# Patient Record
Sex: Female | Born: 1985 | Race: White | Hispanic: No | Marital: Married | Smoking: Never smoker
Health system: Southern US, Community
[De-identification: ages and names within clinical notes are randomized; demographics above are authoritative.]

---

## 2014-05-25 ENCOUNTER — Encounter (HOSPITAL_COMMUNITY): Payer: Self-pay | Admitting: Emergency Medicine

## 2014-05-25 ENCOUNTER — Emergency Department (HOSPITAL_COMMUNITY)
Admission: EM | Admit: 2014-05-25 | Discharge: 2014-05-25 | Disposition: A | Discharge status: Home / Self Care | Payer: No Typology Code available for payment source | Attending: Emergency Medicine | Admitting: Emergency Medicine

## 2014-05-25 ENCOUNTER — Emergency Department (HOSPITAL_COMMUNITY): Payer: No Typology Code available for payment source

## 2014-05-25 DIAGNOSIS — S0181XA Laceration without foreign body of other part of head, initial encounter: Secondary | ICD-10-CM

## 2014-05-25 DIAGNOSIS — Y929 Unspecified place or not applicable: Secondary | ICD-10-CM | POA: Diagnosis not present

## 2014-05-25 DIAGNOSIS — Y9389 Activity, other specified: Secondary | ICD-10-CM | POA: Insufficient documentation

## 2014-05-25 DIAGNOSIS — S0180XA Unspecified open wound of other part of head, initial encounter: Secondary | ICD-10-CM | POA: Insufficient documentation

## 2014-05-25 DIAGNOSIS — W2209XA Striking against other stationary object, initial encounter: Secondary | ICD-10-CM | POA: Insufficient documentation

## 2014-05-25 DIAGNOSIS — S0990XA Unspecified injury of head, initial encounter: Secondary | ICD-10-CM | POA: Diagnosis present

## 2014-05-25 MED ORDER — ONDANSETRON 4 MG PO TBDP
4.0000 mg | ORAL_TABLET | Freq: Once | ORAL | Status: AC
Start: 2014-05-25 — End: 2014-05-25
  Administered 2014-05-25: 4 mg via ORAL
  Filled 2014-05-25: qty 1

## 2014-05-25 MED ORDER — HYDROCODONE-ACETAMINOPHEN 5-325 MG PO TABS
1.0000 | ORAL_TABLET | Freq: Once | ORAL | Status: AC
  Administered 2014-05-25: 1 via ORAL
  Filled 2014-05-25: qty 1

## 2014-05-25 NOTE — ED Notes (Signed)
Dr. Kunz at bedside.  

## 2014-05-25 NOTE — ED Notes (Signed)
The pt has swelling to her rt orbit and a small laceration under her rt eye,  She was struck by a softball that bounced off the ground after striking the base.  No loc.  No vision problems she had on sunglasses and they were broken.  No hyphema seen bleeding controlled.  lmp April 4th she may be pregnant

## 2014-05-25 NOTE — Discharge Instructions (Signed)
Facial Laceration ° A facial laceration is a cut on the face. These injuries can be painful and cause bleeding. Lacerations usually heal quickly, but they need special care to reduce scarring. °DIAGNOSIS  °Your health care provider will take a medical history, ask for details about how the injury occurred, and examine the wound to determine how deep the cut is. °TREATMENT  °Some facial lacerations may not require closure. Others may not be able to be closed because of an increased risk of infection. The risk of infection and the chance for successful closure will depend on various factors, including the amount of time since the injury occurred. °The wound may be cleaned to help prevent infection. If closure is appropriate, pain medicines may be given if needed. Your health care provider will use stitches (sutures), wound glue (adhesive), or skin adhesive strips to repair the laceration. These tools bring the skin edges together to allow for faster healing and a better cosmetic outcome. If needed, you may also be given a tetanus shot. °HOME CARE INSTRUCTIONS °· Only take over-the-counter or prescription medicines as directed by your health care provider. °· Follow your health care provider's instructions for wound care. These instructions will vary depending on the technique used for closing the wound. °For Sutures: °· Keep the wound clean and dry.   °· If you were given a bandage (dressing), you should change it at least once a day. Also change the dressing if it becomes wet or dirty, or as directed by your health care provider.   °· Wash the wound with soap and water 2 times a day. Rinse the wound off with water to remove all soap. Pat the wound dry with a clean towel.   °· After cleaning, apply a thin layer of the antibiotic ointment recommended by your health care provider. This will help prevent infection and keep the dressing from sticking.   °· You may shower as usual after the first 24 hours. Do not soak the  wound in water until the sutures are removed.   °· Get your sutures removed as directed by your health care provider. With facial lacerations, sutures should usually be taken out after 4 5 days to avoid stitch marks.   °· Wait a few days after your sutures are removed before applying any makeup. °For Skin Adhesive Strips: °· Keep the wound clean and dry.   °· Do not get the skin adhesive strips wet. You may bathe carefully, using caution to keep the wound dry.   °· If the wound gets wet, pat it dry with a clean towel.   °· Skin adhesive strips will fall off on their own. You may trim the strips as the wound heals. Do not remove skin adhesive strips that are still stuck to the wound. They will fall off in time.   °For Wound Adhesive: °· You may briefly wet your wound in the shower or bath. Do not soak or scrub the wound. Do not swim. Avoid periods of heavy sweating until the skin adhesive has fallen off on its own. After showering or bathing, gently pat the wound dry with a clean towel.   °· Do not apply liquid medicine, cream medicine, ointment medicine, or makeup to your wound while the skin adhesive is in place. This may loosen the film before your wound is healed.   °· If a dressing is placed over the wound, be careful not to apply tape directly over the skin adhesive. This may cause the adhesive to be pulled off before the wound is healed.   °·   Avoid prolonged exposure to sunlight or tanning lamps while the skin adhesive is in place. °· The skin adhesive will usually remain in place for 5 10 days, then naturally fall off the skin. Do not pick at the adhesive film.   °After Healing: °Once the wound has healed, cover the wound with sunscreen during the day for 1 full year. This can help minimize scarring. Exposure to ultraviolet light in the first year will darken the scar. It can take 1 2 years for the scar to lose its redness and to heal completely.  °SEEK IMMEDIATE MEDICAL CARE IF: °· You have redness, pain, or  swelling around the wound.   °· You see a yellowish-white fluid (pus) coming from the wound.   °· You have chills or a fever.   °MAKE SURE YOU: °· Understand these instructions. °· Will watch your condition. °· Will get help right away if you are not doing well or get worse. °Document Released: 01/20/2005 Document Revised: 10/03/2013 Document Reviewed: 07/26/2013 °Exit[employer]® Patient Information ©2014 [employer]. ° °

## 2014-05-25 NOTE — ED Provider Notes (Signed)
CSN: [id number]     Arrival date & time 05/25/14  1630 History   First MD Initiated Contact with Patient 05/25/14 1638     Chief Complaint  Patient presents with  . Facial Injury     (Consider location/radiation/quality/duration/timing/severity/associated sxs/prior Treatment) Patient is a 28 y.o. female presenting with facial injury. The history is provided by the patient and medical records. No language interpreter was used.  Facial Injury Mechanism of injury:  Direct blow Location:  Face and R cheek Time since incident:  30 minutes Pain details:    Quality:  Aching   Severity:  Moderate   Duration:  30 minutes   Timing:  Constant   Progression:  Waxing and waning Chronicity:  New Foreign body present:  No foreign bodies Relieved by:  Nothing Worsened by:  Movement and pressure Ineffective treatments:  Ice pack Associated symptoms: no altered mental status, no congestion, no double vision, no ear pain, no headaches, no loss of consciousness, no nausea, no trismus, no vomiting and no wheezing     History reviewed. No pertinent past medical history. History reviewed. No pertinent past surgical history. No family history on file. History  Substance Use Topics  . Smoking status: Never Smoker   . Smokeless tobacco: Not on file  . Alcohol Use: Yes   OB History   Grav Para Term Preterm Abortions TAB SAB Ect Mult Living                 Review of Systems  HENT: Negative for congestion and ear pain.   Eyes: Negative for double vision.  Respiratory: Negative for wheezing.   Gastrointestinal: Negative for nausea and vomiting.  Neurological: Negative for loss of consciousness and headaches.  All other systems reviewed and are negative.     Allergies  Review of patient's allergies indicates no known allergies.  Home Medications   Prior to Admission medications   Not on File   BP 126/87  Pulse 90  Temp(Src) 98.4 F (36.9 C) (Oral)  Resp 18  SpO2 98%  LMP  03/30/2014 Physical Exam  Nursing note and vitals reviewed. Constitutional: She is oriented to person, place, and time. She appears well-developed and well-nourished.  HENT:  Head: Normocephalic.    Right Ear: External ear normal.  Left Ear: External ear normal.  Mouth/Throat: Oropharynx is clear and moist.  Eyes: Conjunctivae and EOM are normal. Pupils are equal, round, and reactive to light.  Fundoscopic exam:      The right eye shows no hemorrhage.       The left eye shows no hemorrhage.  Neck: Normal range of motion. Neck supple.  Cardiovascular: Normal rate, regular rhythm, normal heart sounds and intact distal pulses.   Pulmonary/Chest: Effort normal and breath sounds normal. No respiratory distress. She has no wheezes.  Abdominal: Soft. Bowel sounds are normal. She exhibits no distension. There is no tenderness.  Musculoskeletal: Normal range of motion. She exhibits no edema and no tenderness.  Neurological: She is alert and oriented to person, place, and time. She has normal reflexes.  Skin: Skin is warm and dry.  Psychiatric: She has a normal mood and affect.    ED Course  LACERATION REPAIR Date/Time: 05/25/2014 4:55 PM Performed by: Johnney Ou Authorized by: Suzi Roots Consent: Verbal consent obtained. Risks and benefits: risks, benefits and alternatives were discussed Consent given by: patient Patient identity confirmed: verbally with patient and arm band Time out: Immediately prior to procedure a "time out" was called  to verify the correct patient, procedure, equipment, support staff and site/side marked as required. Body area: head/neck Location details: right cheek Laceration length: 0.5 cm Foreign bodies: no foreign bodies Tendon involvement: none Nerve involvement: none Anesthesia: local infiltration Local anesthetic: lidocaine 1% with epinephrine Anesthetic total: 1 ml Patient sedated: no Preparation: Patient was prepped and draped in the usual  sterile fashion. Irrigation solution: saline Irrigation method: syringe Amount of cleaning: extensive Debridement: none Degree of undermining: none Wound skin closure material used: chromic gut - 6-0. Number of sutures: 2 Technique: simple Approximation: close Approximation difficulty: simple Patient tolerance: Patient tolerated the procedure well with no immediate complications.   (including critical care time) Labs Review Labs Reviewed - No data to display  Imaging Review No results found.   EKG Interpretation None      MDM   Final diagnoses:  Facial laceration    Patient presents to the ED with injury to right maxillary region after being struck in face with softball.  She denies LOC, amnesia to event, vision changes, eye pain, and her only complaint is right sided facial pain. PE as above and remarkable for EOMI, edema/TTP over right maxillary region, normal sensation in all nerve distributions, small laceration over right maxillary region, and no other signs of trauma.  CT face completed (patient reported +UPT at home and she understood risks/benifits associated with CT and agreed to have it done) due to concern for fracture and results showed no fxs.  Laceration repaired as above.  Patient discharged with return precautions and instructions for wound care.     Johnney Ouerek Prescott Truex, MD 05/26/14 [phone]

## 2014-05-26 NOTE — ED Provider Notes (Signed)
I saw and evaluated the patient, reviewed the resident's note and I agree with the findings and plan. Pt c/o right facial injury/contusion right face after batted softball struck face at high rate of speed. No loc. No headache. No neck pain. c spine nt. Significant sts right cheek, periorbital area esp inferiorly.    Suzi Roots, MD 05/26/14 [id number]

## 2015-08-05 IMAGING — CT CT MAXILLOFACIAL W/O CM
3 series · 16 of 47 positions shown, 19 images · non-contrast
Comparison: None.

CLINICAL DATA: Softball injury, right orbital swelling with
associated infraorbital laceration

EXAM:
CT MAXILLOFACIAL WITHOUT CONTRAST
TECHNIQUE: Multidetector CT imaging of the maxillofacial structures was
performed. Multiplanar CT image reconstructions were also generated.
A small metallic BB was placed on the right temple in order to
reliably differentiate right from left.

[Series 2: facial/ orbits 2.0 h30s · axial · 0.32mm/px · z∈[-216,-72]mm · 10 of 84 slices shown, 13 images]
[im 6/84  brain]
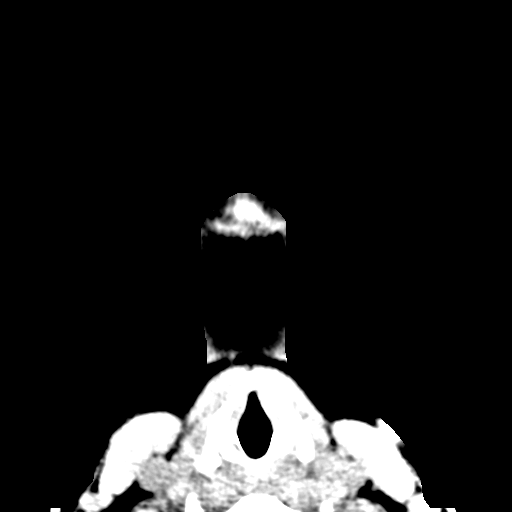
[im 6/84  bone]
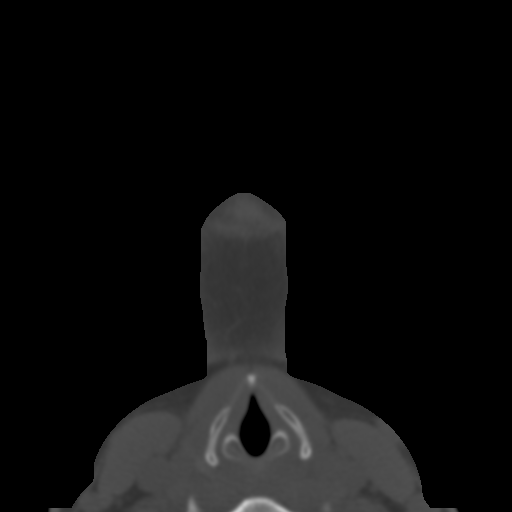
[im 15/84  bone]
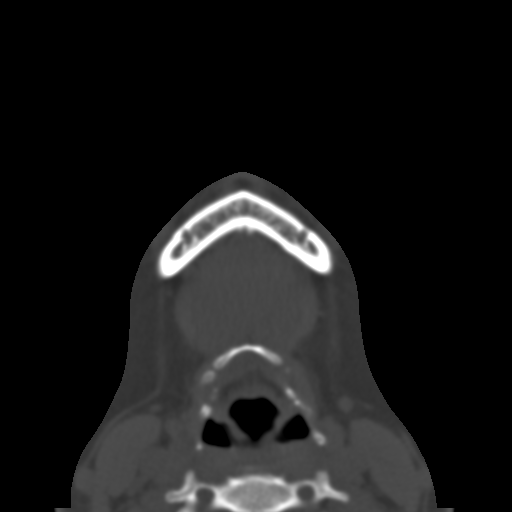
[im 23/84  bone]
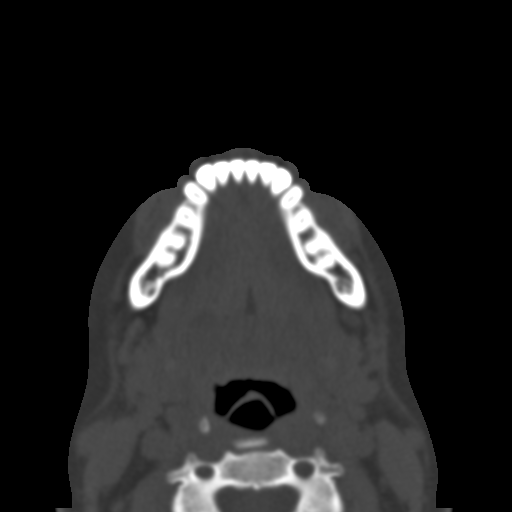
[im 29/84  bone]
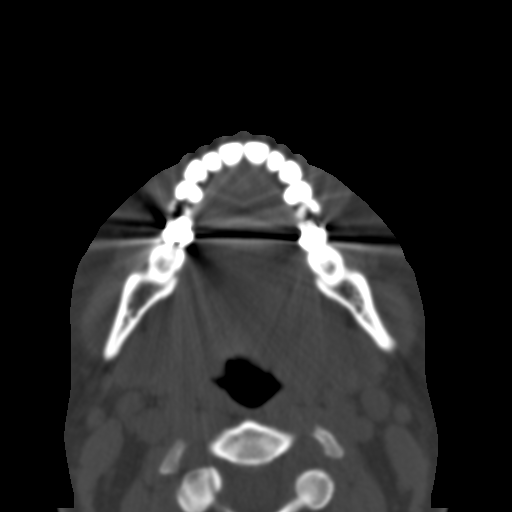
[im 38/84  brain]
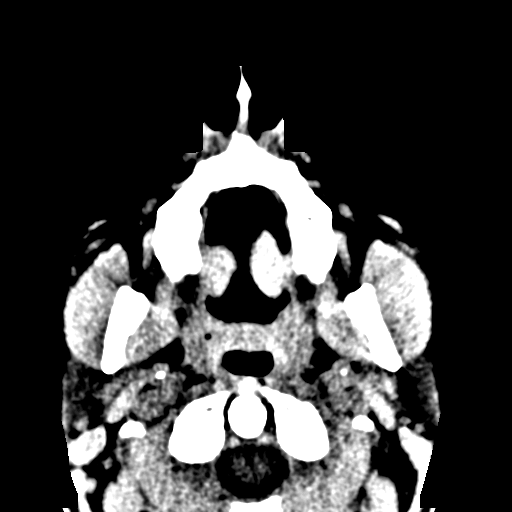
[im 38/84  bone]
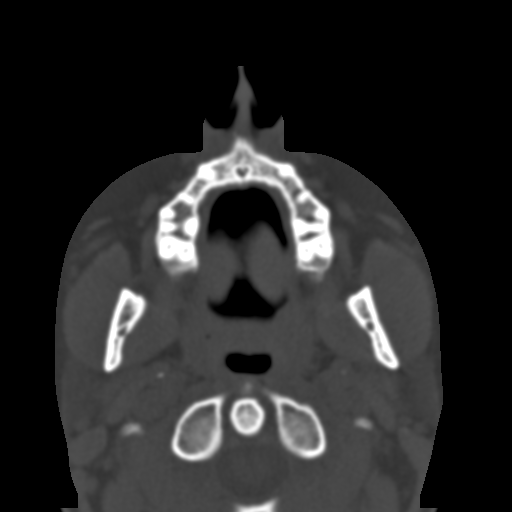
[im 46/84  bone]
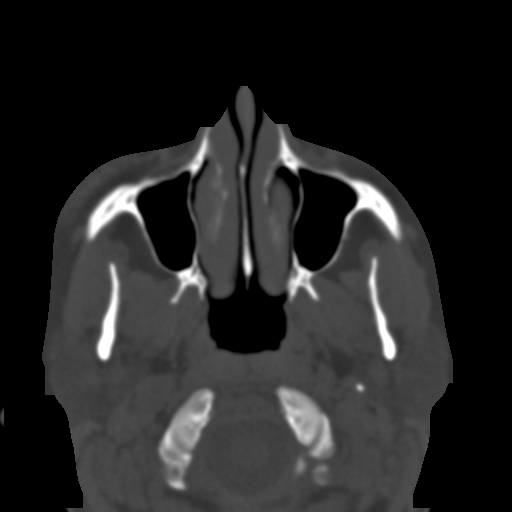
[im 55/84  bone]
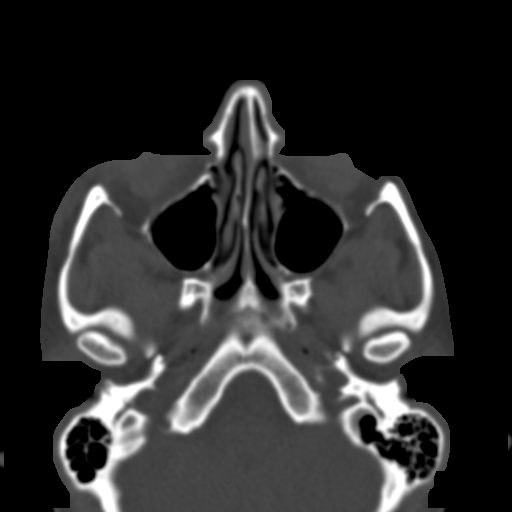
[im 63/84  bone]
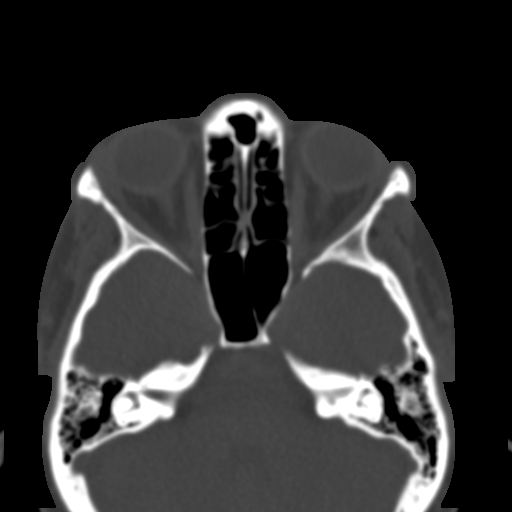
[im 69/84  brain]
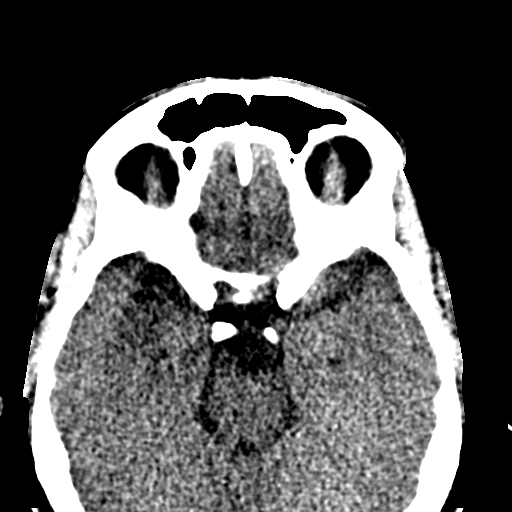
[im 69/84  bone]
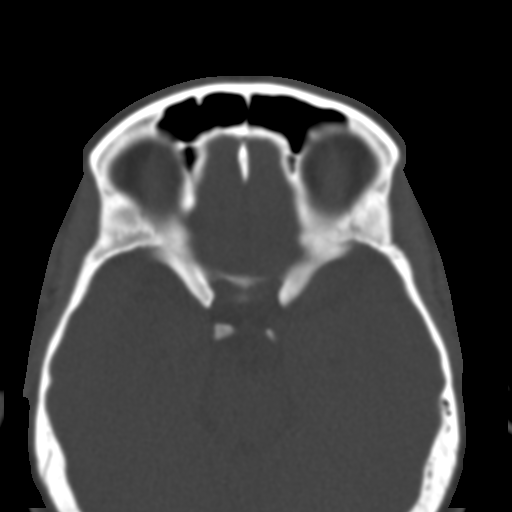
[im 78/84  bone]
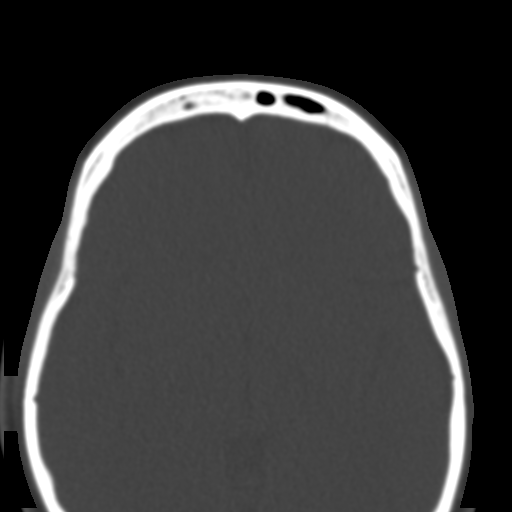

[Series 4: coronal st · coronal · 0.33mm/px · 3 of 59 slices shown]
[im 20/59  bone]
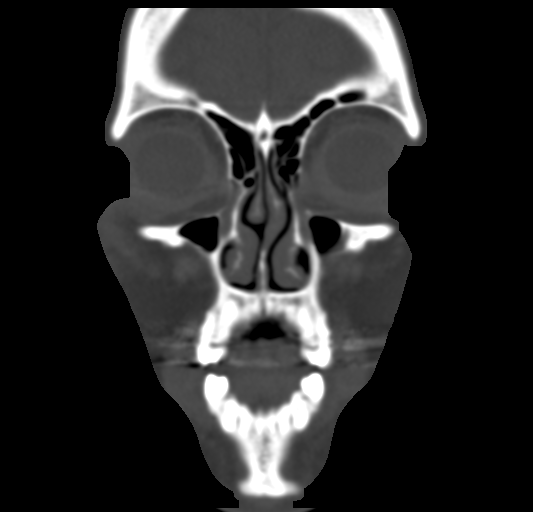
[im 26/59  bone]
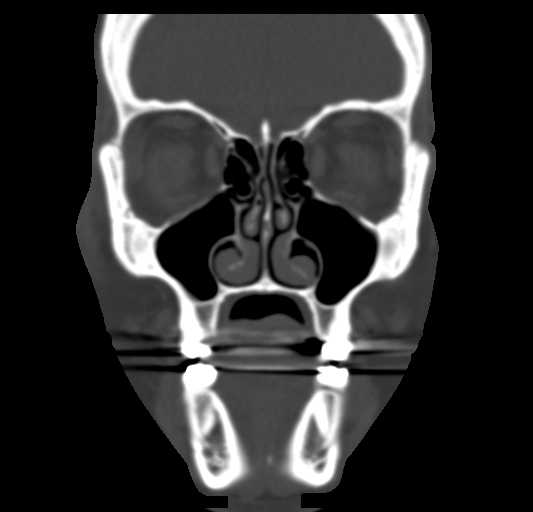
[im 33/59  bone]
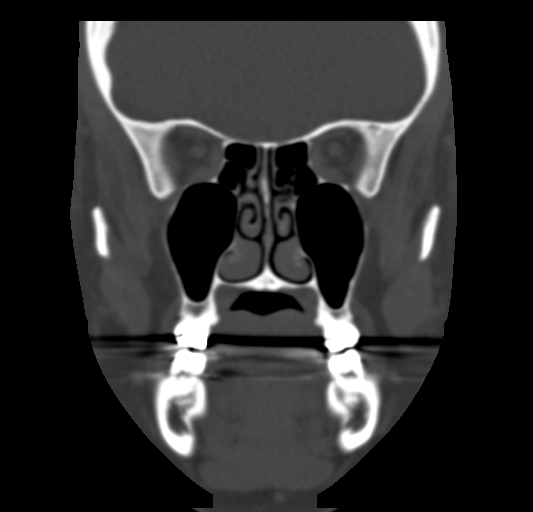

[Series 5: sagittal st · sagittal · 0.31mm/px · 3 of 65 slices shown]
[im 22/65  bone]
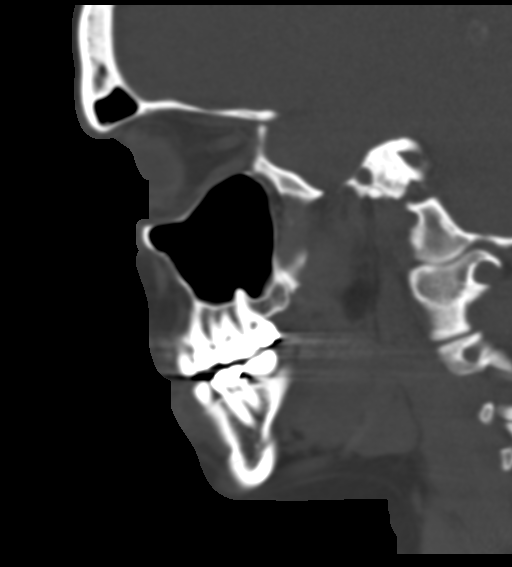
[im 33/65  bone]
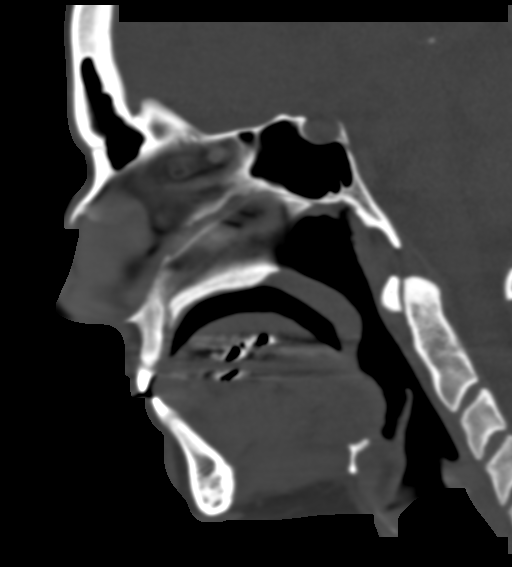
[im 43/65  bone]
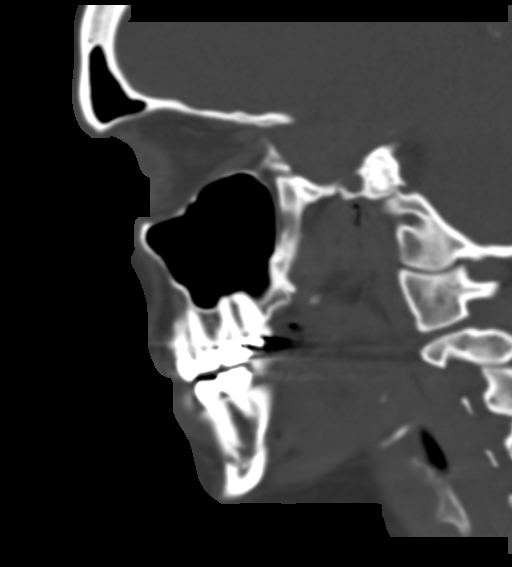

[16 of 47 positions shown; findings below may reference images not displayed]

FINDINGS: Soft tissue swelling overlying the right lateral maxilla/zygoma
(series 2/ image 49).

No evidence of maxillofacial fracture.

The bilateral orbits, including the globes and retroconal soft
tissues, are within normal limits.

The visualized paranasal sinuses are essentially clear. The mastoid
air cells are unopacified.

The mandibular condyles are well seated in the TMJs.

Cervical spine is within normal limits to C4-5.
IMPRESSION: Soft tissue swelling overlying the right lateral maxilla/ zygoma.

Underlying right orbit is within normal limits.

No evidence of maxillofacial fracture.

## 2016-09-28 DIAGNOSIS — O34219 Maternal care for unspecified type scar from previous cesarean delivery: Secondary | ICD-10-CM | POA: Insufficient documentation

## 2020-03-06 ENCOUNTER — Encounter: Payer: Self-pay | Admitting: Family Medicine

## 2020-03-06 ENCOUNTER — Ambulatory Visit (INDEPENDENT_AMBULATORY_CARE_PROVIDER_SITE_OTHER): Payer: No Typology Code available for payment source | Admitting: Family Medicine

## 2020-03-06 ENCOUNTER — Other Ambulatory Visit: Payer: Self-pay

## 2020-03-06 VITALS — BP 129/76 | HR 57 | Temp 98.4°F | Ht 70.0 in | Wt 207.2 lb

## 2020-03-06 DIAGNOSIS — Z13220 Encounter for screening for lipoid disorders: Secondary | ICD-10-CM

## 2020-03-06 DIAGNOSIS — I8001 Phlebitis and thrombophlebitis of superficial vessels of right lower extremity: Secondary | ICD-10-CM | POA: Diagnosis not present

## 2020-03-06 DIAGNOSIS — Z13228 Encounter for screening for other metabolic disorders: Secondary | ICD-10-CM | POA: Diagnosis not present

## 2020-03-06 DIAGNOSIS — Z0000 Encounter for general adult medical examination without abnormal findings: Secondary | ICD-10-CM

## 2020-03-06 DIAGNOSIS — Z0001 Encounter for general adult medical examination with abnormal findings: Secondary | ICD-10-CM

## 2020-03-06 NOTE — Patient Instructions (Addendum)
   If you have lab work done today you will be contacted with your lab results within the next 2 weeks.  If you have not heard from us then please contact us. The fastest way to get your results is to register for My Chart.   IF you received an x-ray today, you will receive an invoice from [facility]. Please contact [facility] at [phone] with questions or concerns regarding your invoice.   IF you received labwork today, you will receive an invoice from [facility]. Please contact [facility] at [phone] with questions or concerns regarding your invoice.   Our billing staff will not be able to assist you with questions regarding bills from these companies.  You will be contacted with the lab results as soon as they are available. The fastest way to get your results is to activate your My Chart account. Instructions are located on the last page of this paperwork. If you have not heard from us regarding the results in 2 weeks, please contact this office.     Preventive Care 21-39 Years Old, Female Preventive care refers to visits with your health care provider and lifestyle choices that can promote health and wellness. This includes:  A yearly physical exam. This may also be called an annual well check.  Regular dental visits and eye exams.  Immunizations.  Screening for certain conditions.  Healthy lifestyle choices, such as eating a healthy diet, getting regular exercise, not using drugs or products that contain nicotine and tobacco, and limiting alcohol use. What can I expect for my preventive care visit? Physical exam Your health care provider will check your:  Height and weight. This may be used to calculate body mass index (BMI), which tells if you are at a healthy weight.  Heart rate and blood pressure.  Skin for abnormal spots. Counseling Your health care provider may ask you questions about your:  Alcohol, tobacco, and drug use.  Emotional  well-being.  Home and relationship well-being.  Sexual activity.  Eating habits.  Work and work environment.  Method of birth control.  Menstrual cycle.  Pregnancy history. What immunizations do I need?  Influenza (flu) vaccine  This is recommended every year. Tetanus, diphtheria, and pertussis (Tdap) vaccine  You may need a Td booster every 10 years. Varicella (chickenpox) vaccine  You may need this if you have not been vaccinated. Human papillomavirus (HPV) vaccine  If recommended by your health care provider, you may need three doses over 6 months. Measles, mumps, and rubella (MMR) vaccine  You may need at least one dose of MMR. You may also need a second dose. Meningococcal conjugate (MenACWY) vaccine  One dose is recommended if you are age 19-21 years and a first-year college student living in a residence hall, or if you have one of several medical conditions. You may also need additional booster doses. Pneumococcal conjugate (PCV13) vaccine  You may need this if you have certain conditions and were not previously vaccinated. Pneumococcal polysaccharide (PPSV23) vaccine  You may need one or two doses if you smoke cigarettes or if you have certain conditions. Hepatitis A vaccine  You may need this if you have certain conditions or if you travel or work in places where you may be exposed to hepatitis A. Hepatitis B vaccine  You may need this if you have certain conditions or if you travel or work in places where you may be exposed to hepatitis B. Haemophilus influenzae type b (Hib) vaccine  You   may need this if you have certain conditions. You may receive vaccines as individual doses or as more than one vaccine together in one shot (combination vaccines). Talk with your health care provider about the risks and benefits of combination vaccines. What tests do I need?  Blood tests  Lipid and cholesterol levels. These may be checked every 5 years starting at age  20.  Hepatitis C test.  Hepatitis B test. Screening  Diabetes screening. This is done by checking your blood sugar (glucose) after you have not eaten for a while (fasting).  Sexually transmitted disease (STD) testing.  BRCA-related cancer screening. This may be done if you have a family history of breast, ovarian, tubal, or peritoneal cancers.  Pelvic exam and Pap test. This may be done every 3 years starting at age 21. Starting at age 30, this may be done every 5 years if you have a Pap test in combination with an HPV test. Talk with your health care provider about your test results, treatment options, and if necessary, the need for more tests. Follow these instructions at home: Eating and drinking   Eat a diet that includes fresh fruits and vegetables, whole grains, lean protein, and low-fat dairy.  Take vitamin and mineral supplements as recommended by your health care provider.  Do not drink alcohol if: ? Your health care provider tells you not to drink. ? You are pregnant, may be pregnant, or are planning to become pregnant.  If you drink alcohol: ? Limit how much you have to 0-1 drink a day. ? Be aware of how much alcohol is in your drink. In the [place] one drink equals one 12 oz bottle of beer (355 mL), one 5 oz glass of wine (148 mL), or one 1 oz glass of hard liquor (44 mL). Lifestyle  Take daily care of your teeth and gums.  Stay active. Exercise for at least 30 minutes on 5 or more days each week.  Do not use any products that contain nicotine or tobacco, such as cigarettes, e-cigarettes, and chewing tobacco. If you need help quitting, ask your health care provider.  If you are sexually active, practice safe sex. Use a condom or other form of birth control (contraception) in order to prevent pregnancy and STIs (sexually transmitted infections). If you plan to become pregnant, see your health care provider for a preconception visit. What's next?  Visit your health  care provider once a year for a well check visit.  Ask your health care provider how often you should have your eyes and teeth checked.  Stay up to date on all vaccines. This information is not intended to replace advice given to you by your health care provider. Make sure you discuss any questions you have with your health care provider. Document Revised: 08/24/2018 Document Reviewed: 08/24/2018 Elsevier Patient Education  2020 Elsevier Inc.  

## 2020-03-06 NOTE — Progress Notes (Signed)
3/11/202111:25 AM  Margaret Bentley 02-20-1986, 34 y.o., female [id number]  Chief Complaint  Patient presents with  . Annual Exam    HPI:   Patient is a 34 y.o. 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 Influenza, Seasonal, Injecte, Preservative Fre 10/09/2014  . Influenza,inj,quad, With Preservative 09/29/2016  . Tdap 07/28/2016    Depression screen PHQ 2/9 03/06/2020  Decreased Interest 0  Down, Depressed, Hopeless 0  PHQ - 2 Score 0    Fall Risk  03/06/2020  Falls in the past year? 0  Number falls in past yr: 0  Injury with Fall? 0     No Known Allergies  Prior to Admission medications   Medication Sig Start Date End Date Taking? Authorizing Provider  Multiple Vitamins-Minerals (MULTIVITAMIN WITH MINERALS) tablet Take 1 tablet by mouth daily.   Yes [provider]    History reviewed. No pertinent past medical history.  Past Surgical History:  Procedure Laterality Date  . CESAREAN SECTION      Social History   Tobacco Use  . Smoking status: Never Smoker  . Smokeless tobacco: Never Used  Substance Use Topics  . Alcohol use: Yes    Family History  Problem Relation Age of Onset  .  Hyperlipidemia Mother   . Hyperlipidemia Father     Review of Systems  Constitutional: Negative for chills and fever.  Respiratory: Negative for cough and shortness of breath.   Cardiovascular: Negative for chest pain, palpitations and leg swelling.  Gastrointestinal: Negative for abdominal pain, nausea and vomiting.  Musculoskeletal: Positive for joint pain (right anterior shoulder, intermmitent).  Neurological: Negative for tingling and focal weakness.  Psychiatric/Behavioral: Negative for depression. The patient is not nervous/anxious and does not have insomnia.   All other systems reviewed and are negative. per hpi   OBJECTIVE:  Today's Vitals   03/06/20 1116  BP: 129/76  Pulse: (!) 57  Temp: 98.4 F (36.9 C)  Weight: 207 lb 3.2 oz (94 kg)  Height: _0  (1.778 m)   Body mass index is 29.73 kg/m.   Physical Exam Vitals and nursing note reviewed.  Constitutional:      Appearance: She is well-developed.  HENT:     Head: Normocephalic and atraumatic.     Right Ear: Hearing, tympanic membrane, ear canal and external ear normal.     Left Ear: Hearing, tympanic membrane, ear canal and external ear normal.     Mouth/Throat:     Mouth: Mucous membranes are moist.     Pharynx: No oropharyngeal exudate or posterior oropharyngeal erythema.  Eyes:     Extraocular  Movements: Extraocular movements intact.     Conjunctiva/sclera: Conjunctivae normal.     Pupils: Pupils are equal, round, and reactive to light.  Neck:     Thyroid: No thyromegaly.  Cardiovascular:     Rate and Rhythm: Normal rate and regular rhythm.     Heart sounds: Normal heart sounds. No murmur. No friction rub. No gallop.   Pulmonary:     Effort: Pulmonary effort is normal.     Breath sounds: Normal breath sounds. No wheezing, rhonchi or rales.  Abdominal:     General: Bowel sounds are normal. There is no distension.     Palpations: Abdomen is soft. There is no hepatomegaly, splenomegaly or mass.      Tenderness: There is no abdominal tenderness.  Musculoskeletal:        General: Normal range of motion.     Cervical back: Neck supple.     Right lower leg: No edema.     Left lower leg: No edema.     Comments: Right thigh, faint erythema, no warmth, TTP induration overlying superficial vein along inner aspect.   Lymphadenopathy:     Cervical: No cervical adenopathy.  Skin:    General: Skin is warm and dry.  Neurological:     Mental Status: She is alert and oriented to person, place, and time.     Cranial Nerves: No cranial nerve deficit.     Gait: Gait normal.     Deep Tendon Reflexes: Reflexes are normal and symmetric.  Psychiatric:        Mood and Affect: Mood normal.        Behavior: Behavior normal.     No results found for this or any previous visit (from the past 24 hour(s)).  No results found.   ASSESSMENT and PLAN  1. Annual physical exam Routine HCM labs ordered. HCM reviewed/discussed. Anticipatory guidance regarding healthy weight, lifestyle and choices given.   2. Screening for lipid disorders - Lipid panel  3. Encounter for screening for metabolic disorder - [id number]  4. Phlebitis and thombophlb of superfic vessels of r low extrem ? Superficial phlebitis? Discussed conservative measures.  - VAS [place] LOWER EXTREMITY VENOUS (DVT); Future  Other orders - Multiple Vitamins-Minerals (MULTIVITAMIN WITH MINERALS) tablet; Take 1 tablet by mouth daily.  Return in about 1 year (around 03/06/2021).    Rutherford Guys, MD [facility] [place], [place] [address] Ph.  [phone] Fax [phone]

## 2020-03-07 LAB — CMP14+EGFR
ALT: 18 IU/L (ref 0–32)
AST: 19 IU/L (ref 0–40)
Albumin/Globulin Ratio: 2.4 — ABNORMAL HIGH (ref 1.2–2.2)
Albumin: 5 g/dL — ABNORMAL HIGH (ref 3.8–4.8)
Alkaline Phosphatase: 51 IU/L (ref 39–117)
BUN/Creatinine Ratio: 24 — ABNORMAL HIGH (ref 9–23)
BUN: 19 mg/dL (ref 6–20)
Bilirubin Total: 0.4 mg/dL (ref 0.0–1.2)
CO2: 23 mmol/L (ref 20–29)
Calcium: 9.3 mg/dL (ref 8.7–10.2)
Chloride: 104 mmol/L (ref 96–106)
Creatinine, Ser: 0.8 mg/dL (ref 0.57–1.00)
GFR calc Af Amer: 110 mL/min/{1.73_m2} (ref >59)
GFR calc non Af Amer: 96 mL/min/{1.73_m2} (ref >59)
Globulin, Total: 2.1 g/dL (ref 1.5–4.5)
Glucose: 92 mg/dL (ref 65–99)
Potassium: 4.7 mmol/L (ref 3.5–5.2)
Sodium: 139 mmol/L (ref 134–144)
Total Protein: 7.1 g/dL (ref 6.0–8.5)

## 2020-03-07 LAB — LIPID PANEL
Chol/HDL Ratio: 3.9 ratio (ref 0.0–4.4)
Cholesterol, Total: 224 mg/dL — ABNORMAL HIGH (ref 100–199)
HDL: 57 mg/dL (ref >39)
LDL Chol Calc (NIH): 145 mg/dL — ABNORMAL HIGH (ref 0–99)
Triglycerides: 123 mg/dL (ref 0–149)
VLDL Cholesterol Cal: 22 mg/dL (ref 5–40)

## 2020-03-17 ENCOUNTER — Other Ambulatory Visit: Payer: Self-pay

## 2020-03-17 ENCOUNTER — Ambulatory Visit (HOSPITAL_COMMUNITY)
Admission: RE | Admit: 2020-03-17 | Discharge: 2020-03-17 | Disposition: A | Discharge status: Home / Self Care | Payer: Medicaid Other | Source: Ambulatory Visit | Attending: Family Medicine | Admitting: Family Medicine

## 2020-03-17 ENCOUNTER — Telehealth: Payer: Self-pay

## 2020-03-17 DIAGNOSIS — I8001 Phlebitis and thrombophlebitis of superficial vessels of right lower extremity: Secondary | ICD-10-CM

## 2020-03-17 NOTE — Telephone Encounter (Signed)
[facility] calling report DVT of right leg negative and they are letting pt go.  Backline number given so in future they will not have to go thru the call center.

## 2024-12-05 NOTE — Patient Instructions (Signed)
 SURGICAL WAITING ROOM VISITATION Patients having surgery or a procedure may have no more than 2 support people in the waiting area - these visitors may rotate in the visitor waiting room.   Due to an increase in RSV and influenza rates and associated hospitalizations, children ages 37 and under may not visit patients in [facility] hospitals. If the patient needs to stay at the hospital during part of their recovery, the visitor guidelines for inpatient rooms apply.  PRE-OP VISITATION  Pre-op nurse will coordinate an appropriate time for 1 support person to accompany the patient in pre-op.  This support person may not rotate.  This visitor will be contacted when the time is appropriate for the visitor to come back in the pre-op area.  Please refer to the [facility] website for the visitor guidelines for Inpatients (after your surgery is over and you are in a regular room).  You are not required to quarantine at this time prior to your surgery. However, you must do this: Hand Hygiene often Do NOT share personal items Notify your provider if you are in close contact with someone who has COVID or you develop fever 100.4 or greater, new onset of sneezing, cough, sore throat, shortness of breath or body aches.  If you test positive for Covid or have been in contact with anyone that has tested positive in the last 10 days please notify you surgeon.    Your procedure is scheduled on:  12/10/24  Report to [facility] Main Entrance: [place] entrance where the [employer] is available.   Report to admitting at: 5:15 AM  Call this number if you have any questions or problems the morning of surgery [phone]  FOLLOW ANY ADDITIONAL PRE OP INSTRUCTIONS YOU RECEIVED FROM YOUR SURGEON'S OFFICE!!!  Do not eat food after Midnight the night prior to your surgery/procedure.  After Midnight you may have the following liquids until : 4:30 AM DAY OF SURGERY  Clear Liquid Diet Water Black  Coffee (sugar ok, NO MILK/CREAM OR CREAMERS)  Tea (sugar ok, NO MILK/CREAM OR CREAMERS) regular and decaf                             Plain Jell-O  with no fruit (NO RED)                                           Fruit ices (not with fruit pulp, NO RED)                                     Popsicles (NO RED)                                                                  Juice: NO CITRUS JUICES: only apple, WHITE grape, WHITE cranberry Sports drinks like Gatorade or Powerade (NO RED)   The day of surgery:  Drink ONE (1) Pre-Surgery Clear Ensure at : 4:30 AM the morning of surgery. Drink in one sitting. Do not sip.  This drink was  given to you during your hospital pre-op appointment visit. Nothing else to drink after completing the Pre-Surgery Clear Ensure or G2 : No candy, chewing gum or throat lozenges.    Oral Hygiene is also important to reduce your risk of infection.        Remember - BRUSH YOUR TEETH THE MORNING OF SURGERY WITH YOUR REGULAR TOOTHPASTE  Do NOT smoke after Midnight the night before surgery.  STOP TAKING all Vitamins, Herbs and supplements 1 week before your surgery.   Take ONLY these medicines the morning of surgery with A SIP OF WATER: NONE                   You may not have any metal on your body including hair pins, jewelry, and body piercing  Do not wear make-up, lotions, powders, perfumes / cologne, or deodorant  Do not wear nail polish including gel and S&S, artificial / acrylic nails, or any other type of covering on natural nails including finger and toenails. If you have artificial nails, gel coating, etc., that needs to be removed by a nail salon, Please have this removed prior to surgery. Not doing so may mean that your surgery could be cancelled or delayed if the Surgeon or anesthesia staff feels like they are unable to monitor you safely.   Do not shave 48 hours prior to surgery to avoid nicks in your skin which may contribute to postoperative infections.    Contacts, Hearing Aids, dentures or bridgework may not be worn into surgery. DENTURES WILL BE REMOVED PRIOR TO SURGERY PLEASE DO NOT APPLY Poly grip OR ADHESIVES!!!  You may bring a small overnight bag with you on the day of surgery, only pack items that are not valuable. [facility] IS NOT RESPONSIBLE   FOR VALUABLES THAT ARE LOST OR STOLEN.   Patients discharged on the day of surgery will not be allowed to drive home.  Someone NEEDS to stay with you for the first 24 hours after anesthesia.  Do not bring your home medications to the hospital. The Pharmacy will dispense medications listed on your medication list to you during your admission in the Hospital.  Special Instructions: Bring a copy of your healthcare power of attorney and living will documents the day of surgery, if you wish to have them scanned into your [facility]- EPIC  Please read over the following fact sheets you were given: IF YOU HAVE QUESTIONS ABOUT YOUR PRE-OP INSTRUCTIONS, PLEASE CALL [phone]   [facility] - Preparing for Surgery      Before surgery, you can play an important role.  Because skin is not sterile, your skin needs to be as free of germs as possible.  You can reduce the number of germs on your skin by washing with CHG (chlorahexidine gluconate) soap before surgery.  CHG is an antiseptic cleaner which kills germs and bonds with the skin to continue killing germs even after washing. Please DO NOT use if you have an allergy to CHG or antibacterial soaps.  If your skin becomes reddened/irritated stop using the CHG and inform your nurse when you arrive at Short Stay. Do not shave (including legs and underarms) for at least 48 hours prior to the first CHG shower.  You may shave your face/neck.  Please follow these instructions carefully:  1.  Shower with CHG Soap the night before surgery ONLY (DO NOT USE THE SOAP THE MORNING OF SURGERY).  2.  If you choose to wash  your hair, wash your hair  first as usual with your normal  shampoo.  3.  After you shampoo, rinse your hair and body thoroughly to remove the shampoo.                             4.  Use CHG as you would any other liquid soap.  You can apply chg directly to the skin and wash.  Gently with a scrungie or clean washcloth.  5.  Apply the CHG Soap to your body ONLY FROM THE NECK DOWN.   Do not use on face/ open                           Wound or open sores. Avoid contact with eyes, ears mouth and genitals (private parts).                       Wash face,  Genitals (private parts) with your normal soap.             6.  Wash thoroughly, paying special attention to the area where your  surgery  will be performed.  7.  Thoroughly rinse your body with warm water from the neck down.  8.  DO NOT shower/wash with your normal soap after using and rinsing off the CHG Soap.                9.  Pat yourself dry with a clean towel.            10.  Wear clean pajamas.            11.  Place clean sheets on your bed the night of your first shower and do not  sleep with pets.  Day of Surgery : Do not apply any CHG, lotions/deodorants the morning of surgery.  Please wear clean clothes to the hospital/surgery center.   FAILURE TO FOLLOW THESE INSTRUCTIONS MAY RESULT IN THE CANCELLATION OF YOUR SURGERY  PATIENT SIGNATURE_________________________________  NURSE SIGNATURE__________________________________  ________________________________________________________________________  Nasario Exon  An incentive spirometer is a tool that can help keep your lungs clear and active. This tool measures how well you are filling your lungs with each breath. Taking long deep breaths may help reverse or decrease the chance of developing breathing (pulmonary) problems (especially infection) following: A long period of time when you are unable to move or be active. BEFORE THE PROCEDURE  If the spirometer includes an indicator to show your best  effort, your nurse or respiratory therapist will set it to a desired goal. If possible, sit up straight or lean slightly forward. Try not to slouch. Hold the incentive spirometer in an upright position. INSTRUCTIONS FOR USE  Sit on the edge of your bed if possible, or sit up as far as you can in bed or on a chair. Hold the incentive spirometer in an upright position. Breathe out normally. Place the mouthpiece in your mouth and seal your lips tightly around it. Breathe in slowly and as deeply as possible, raising the piston or the ball toward the top of the column. Hold your breath for 3-5 seconds or for as long as possible. Allow the piston or ball to fall to the bottom of the column. Remove the mouthpiece from your mouth and breathe out normally. Rest for a few seconds and repeat Steps 1 through 7 at least 10 times every  1-2 hours when you are awake. Take your time and take a few normal breaths between deep breaths. The spirometer may include an indicator to show your best effort. Use the indicator as a goal to work toward during each repetition. After each set of 10 deep breaths, practice coughing to be sure your lungs are clear. If you have an incision (the cut made at the time of surgery), support your incision when coughing by placing a pillow or rolled up towels firmly against it. Once you are able to get out of bed, walk around indoors and cough well. You may stop using the incentive spirometer when instructed by your caregiver.  RISKS AND COMPLICATIONS Take your time so you do not get dizzy or light-headed. If you are in pain, you may need to take or ask for pain medication before doing incentive spirometry. It is harder to take a deep breath if you are having pain. AFTER USE Rest and breathe slowly and easily. It can be helpful to keep track of a log of your progress. Your caregiver can provide you with a simple table to help with this. If you are using the spirometer at home, follow  these instructions: SEEK MEDICAL CARE IF:  You are having difficultly using the spirometer. You have trouble using the spirometer as often as instructed. Your pain medication is not giving enough relief while using the spirometer. You develop fever of 100.5 F (38.1 C) or higher. SEEK IMMEDIATE MEDICAL CARE IF:  You cough up bloody sputum that had not been present before. You develop fever of 102 F (38.9 C) or greater. You develop worsening pain at or near the incision site. MAKE SURE YOU:  Understand these instructions. Will watch your condition. Will get help right away if you are not doing well or get worse. Document Released: 04/25/2007 Document Revised: 03/06/2012 Document Reviewed: 06/26/2007 [facility] Patient Information 2014 [place], [place].   ________________________________________________________________________

## 2024-12-06 ENCOUNTER — Encounter (HOSPITAL_COMMUNITY): Admission: RE | Admit: 2024-12-06 | Discharge: 2024-12-06

## 2024-12-06 ENCOUNTER — Other Ambulatory Visit: Payer: Self-pay

## 2024-12-06 ENCOUNTER — Encounter (HOSPITAL_COMMUNITY): Payer: Self-pay

## 2024-12-06 VITALS — BP 132/90 | HR 63 | Temp 98.6°F | Ht 70.0 in | Wt 202.0 lb

## 2024-12-06 DIAGNOSIS — Z01818 Encounter for other preprocedural examination: Secondary | ICD-10-CM

## 2024-12-06 LAB — CBC
HCT: 37.9 % (ref 36.0–46.0)
Hemoglobin: 13.6 g/dL (ref 12.0–15.0)
MCH: 31.6 pg (ref 26.0–34.0)
MCHC: 35.9 g/dL (ref 30.0–36.0)
MCV: 88.1 fL (ref 80.0–100.0)
Platelets: 250 K/uL (ref 150–400)
RBC: 4.3 MIL/uL (ref 3.87–5.11)
RDW: 11.7 % (ref 11.5–15.5)
WBC: 6.5 K/uL (ref 4.0–10.5)
nRBC: 0 % (ref 0.0–0.2)

## 2024-12-06 LAB — BASIC METABOLIC PANEL WITH GFR
Anion gap: 10 (ref 5–15)
BUN: 18 mg/dL (ref 6–20)
CO2: 25 mmol/L (ref 22–32)
Calcium: 9.6 mg/dL (ref 8.9–10.3)
Chloride: 106 mmol/L (ref 98–111)
Creatinine, Ser: 0.7 mg/dL (ref 0.44–1.00)
GFR, Estimated: >60 mL/min (ref >60)
Glucose, Bld: 96 mg/dL (ref 70–99)
Potassium: 4.2 mmol/L (ref 3.5–5.1)
Sodium: 141 mmol/L (ref 135–145)

## 2024-12-06 NOTE — Progress Notes (Signed)
 For Anesthesia: PCP - NO PCP Cardiologist - N/A  Bowel Prep reminder:  Chest x-ray -  EKG -  Stress Test -  ECHO -  Cardiac Cath -  Pacemaker/ICD device last checked: Pacemaker orders received: Device Rep notified:  Spinal Cord Stimulator:N/A  Sleep Study - N/A CPAP -   Fasting Blood Sugar - N/A Checks Blood Sugar _____ times a day Date and result of last Hgb A1c-  Last dose of GLP1 agonist- N/A GLP1 instructions: Hold 7 days prior to schedule (Hold 24 hours-daily)   Last dose of SGLT-2 inhibitors- N/A SGLT-2 instructions: Hold 72 hours prior to surgery  Blood Thinner Instructions:N/A Last Dose: Time last taken:  Aspirin Instructions:N/A Last Dose: Time last taken:  Activity level: Can go up a flight of stairs and activities of daily living without stopping and without chest pain and/or shortness of breath   Able to exercise without chest pain and/or shortness of breath    Anesthesia review:   Patient denies shortness of breath, fever, cough and chest pain at PAT appointment   Patient verbalized understanding of instructions that were reviewed over the telephone.

## 2024-12-10 ENCOUNTER — Encounter (HOSPITAL_COMMUNITY): Payer: Self-pay | Admitting: Medical

## 2024-12-10 ENCOUNTER — Ambulatory Visit (HOSPITAL_COMMUNITY): Admitting: Anesthesiology

## 2024-12-10 ENCOUNTER — Encounter (HOSPITAL_COMMUNITY): Admission: RE | Disposition: A | Payer: Self-pay | Source: Home / Self Care

## 2024-12-10 ENCOUNTER — Other Ambulatory Visit: Payer: Self-pay

## 2024-12-10 ENCOUNTER — Encounter (HOSPITAL_COMMUNITY): Payer: Self-pay

## 2024-12-10 ENCOUNTER — Ambulatory Visit (HOSPITAL_COMMUNITY)

## 2024-12-10 ENCOUNTER — Ambulatory Visit (HOSPITAL_COMMUNITY): Admission: RE | Admit: 2024-12-10 | Discharge: 2024-12-10 | Disposition: A

## 2024-12-10 DIAGNOSIS — S83522A Sprain of posterior cruciate ligament of left knee, initial encounter: Secondary | ICD-10-CM

## 2024-12-10 DIAGNOSIS — S83282A Other tear of lateral meniscus, current injury, left knee, initial encounter: Secondary | ICD-10-CM | POA: Diagnosis not present

## 2024-12-10 DIAGNOSIS — Z01818 Encounter for other preprocedural examination: Secondary | ICD-10-CM

## 2024-12-10 DIAGNOSIS — M948X6 Other specified disorders of cartilage, lower leg: Secondary | ICD-10-CM | POA: Diagnosis not present

## 2024-12-10 DIAGNOSIS — S83512A Sprain of anterior cruciate ligament of left knee, initial encounter: Secondary | ICD-10-CM | POA: Diagnosis not present

## 2024-12-10 DIAGNOSIS — X58XXXA Exposure to other specified factors, initial encounter: Secondary | ICD-10-CM | POA: Diagnosis not present

## 2024-12-10 DIAGNOSIS — S83419A Sprain of medial collateral ligament of unspecified knee, initial encounter: Secondary | ICD-10-CM | POA: Diagnosis not present

## 2024-12-10 DIAGNOSIS — M24662 Ankylosis, left knee: Secondary | ICD-10-CM | POA: Diagnosis not present

## 2024-12-10 HISTORY — PX: MEDIAL COLLATERAL LIGAMENT REPAIR, KNEE: SHX2019

## 2024-12-10 HISTORY — PX: KNEE ARTHROSCOPY WITH MENISCAL REPAIR: SHX5653

## 2024-12-10 HISTORY — PX: KNEE ARTHROSCOPY WITH ANTERIOR CRUCIATE LIGAMENT (ACL) REPAIR: SHX5644

## 2024-12-10 HISTORY — PX: KNEE ARTHROSCOPY WITH POSTERIOR CRUCIATE LIGAMENT (PCL) RECONSTRUCTION: SHX5657

## 2024-12-10 LAB — POCT PREGNANCY, URINE: Preg Test, Ur: NEGATIVE

## 2024-12-10 SURGERY — KNEE ARTHROSCOPY WITH ANTERIOR CRUCIATE LIGAMENT (ACL) REPAIR
Anesthesia: Regional | Site: Knee | Laterality: Left

## 2024-12-10 MED ORDER — OXYCODONE HCL 5 MG PO TABS: 5.0000 mg | ORAL_TABLET | Freq: Four times a day (QID) | ORAL | 0 refills | Status: AC | PRN

## 2024-12-10 MED ORDER — ROPIVACAINE HCL 5 MG/ML IJ SOLN
INTRAMUSCULAR | Status: DC | PRN
  Administered 2024-12-10: 07:00:00 30 mL via PERINEURAL

## 2024-12-10 MED ORDER — MIDAZOLAM HCL 2 MG/2ML IJ SOLN
INTRAMUSCULAR | Status: AC
  Filled 2024-12-10: qty 2

## 2024-12-10 MED ORDER — ROCURONIUM BROMIDE 100 MG/10ML IV SOLN
INTRAVENOUS | Status: DC | PRN
  Administered 2024-12-10: 09:00:00 30 mg via INTRAVENOUS
  Administered 2024-12-10: 16:00:00 20 mg via INTRAVENOUS
  Administered 2024-12-10: 10:00:00 30 mg via INTRAVENOUS
  Administered 2024-12-10: 16:00:00 10 mg via INTRAVENOUS
  Administered 2024-12-10: 08:00:00 80 mg via INTRAVENOUS
  Administered 2024-12-10: 09:00:00 30 mg via INTRAVENOUS

## 2024-12-10 MED ORDER — ONDANSETRON HCL 4 MG/2ML IJ SOLN
INTRAMUSCULAR | Status: DC | PRN
  Administered 2024-12-10 (×2): 4 mg via INTRAVENOUS

## 2024-12-10 MED ORDER — VANCOMYCIN HCL 1000 MG IV SOLR
INTRAVENOUS | Status: DC | PRN
  Administered 2024-12-10 (×2): 1000 mg

## 2024-12-10 MED ORDER — VANCOMYCIN HCL 1000 MG IV SOLR
INTRAVENOUS | Status: AC
  Filled 2024-12-10: qty 20

## 2024-12-10 MED ORDER — DEXMEDETOMIDINE HCL IN NACL 80 MCG/20ML IV SOLN
INTRAVENOUS | Status: DC | PRN
  Administered 2024-12-10: 08:00:00 8 ug via INTRAVENOUS
  Administered 2024-12-10 (×4): 4 ug via INTRAVENOUS

## 2024-12-10 MED ORDER — SUGAMMADEX SODIUM 200 MG/2ML IV SOLN
INTRAVENOUS | Status: AC
  Filled 2024-12-10: qty 2

## 2024-12-10 MED ORDER — EPINEPHRINE PF 1 MG/ML IJ SOLN
INTRAMUSCULAR | Status: AC
  Filled 2024-12-10: qty 4

## 2024-12-10 MED ORDER — ASPIRIN 81 MG PO TBEC: 81.0000 mg | DELAYED_RELEASE_TABLET | Freq: Two times a day (BID) | ORAL | 0 refills | Status: AC

## 2024-12-10 MED ORDER — PROPOFOL 500 MG/50ML IV EMUL
INTRAVENOUS | Status: AC
  Filled 2024-12-10: qty 50

## 2024-12-10 MED ORDER — CEFAZOLIN SODIUM-DEXTROSE 2-4 GM/100ML-% IV SOLN
INTRAVENOUS | Status: AC
  Filled 2024-12-10: qty 100

## 2024-12-10 MED ORDER — ORAL CARE MOUTH RINSE: 15.0000 mL | Freq: Once | OROMUCOSAL | Status: AC

## 2024-12-10 MED ORDER — ONDANSETRON HCL 4 MG/2ML IJ SOLN
INTRAMUSCULAR | Status: AC
  Filled 2024-12-10: qty 2

## 2024-12-10 MED ORDER — ACETAMINOPHEN 500 MG PO TABS
1000.0000 mg | ORAL_TABLET | Freq: Once | ORAL | Status: AC
  Filled 2024-12-10: qty 2

## 2024-12-10 MED ORDER — SODIUM CHLORIDE (PF) 0.9 % IJ SOLN
INTRAMUSCULAR | Status: AC
  Filled 2024-12-10: qty 10

## 2024-12-10 MED ORDER — FENTANYL CITRATE (PF) 50 MCG/ML IJ SOSY: 50.0000 ug | PREFILLED_SYRINGE | INTRAMUSCULAR | Status: DC

## 2024-12-10 MED ORDER — LIDOCAINE HCL (PF) 2 % IJ SOLN
INTRAMUSCULAR | Status: AC
  Filled 2024-12-10: qty 5

## 2024-12-10 MED ORDER — CEFAZOLIN SODIUM 1 G IJ SOLR
INTRAMUSCULAR | Status: AC
  Filled 2024-12-10: qty 20

## 2024-12-10 MED ORDER — ROCURONIUM BROMIDE 10 MG/ML (PF) SYRINGE
PREFILLED_SYRINGE | INTRAVENOUS | Status: AC
  Filled 2024-12-10: qty 10

## 2024-12-10 MED ORDER — ACETAMINOPHEN 500 MG PO TABS
1000.0000 mg | ORAL_TABLET | Freq: Once | ORAL | Status: AC
  Administered 2024-12-10: 06:00:00 1000 mg via ORAL

## 2024-12-10 MED ORDER — TRANEXAMIC ACID-NACL 1000-0.7 MG/100ML-% IV SOLN: 1000.0000 mg | INTRAVENOUS | Status: AC

## 2024-12-10 MED ORDER — FENTANYL CITRATE (PF) 100 MCG/2ML IJ SOLN
INTRAMUSCULAR | Status: DC | PRN
  Administered 2024-12-10: 07:00:00 100 ug via INTRAVENOUS
  Administered 2024-12-10: 09:00:00 50 ug via INTRAVENOUS
  Administered 2024-12-10 (×2): 25 ug via INTRAVENOUS
  Administered 2024-12-10: 08:00:00 50 ug via INTRAVENOUS

## 2024-12-10 MED ORDER — OXYCODONE HCL 5 MG/5ML PO SOLN: 5.0000 mg | Freq: Once | ORAL | Status: DC | PRN

## 2024-12-10 MED ORDER — TRANEXAMIC ACID-NACL 1000-0.7 MG/100ML-% IV SOLN
1000.0000 mg | INTRAVENOUS | Status: AC
  Administered 2024-12-10: 08:00:00 1000 mg via INTRAVENOUS
  Filled 2024-12-10: qty 100

## 2024-12-10 MED ORDER — CEFAZOLIN SODIUM-DEXTROSE 2-4 GM/100ML-% IV SOLN
2.0000 g | INTRAVENOUS | Status: AC
  Administered 2024-12-10 (×3): 2 g via INTRAVENOUS

## 2024-12-10 MED ORDER — HYDROMORPHONE HCL 2 MG/ML IJ SOLN
INTRAMUSCULAR | Status: AC
  Filled 2024-12-10: qty 1

## 2024-12-10 MED ORDER — HYDROMORPHONE HCL 1 MG/ML IJ SOLN
0.2500 mg | INTRAMUSCULAR | Status: DC | PRN
  Administered 2024-12-10 (×2): 0.5 mg via INTRAVENOUS

## 2024-12-10 MED ORDER — KETAMINE HCL 10 MG/ML IJ SOLN
INTRAMUSCULAR | Status: DC | PRN
  Administered 2024-12-10 (×2): 10 mg via INTRAVENOUS
  Administered 2024-12-10: 08:00:00 20 mg via INTRAVENOUS
  Administered 2024-12-10: 11:00:00 10 mg via INTRAVENOUS

## 2024-12-10 MED ORDER — PROPOFOL 1000 MG/100ML IV EMUL
INTRAVENOUS | Status: AC
  Filled 2024-12-10: qty 100

## 2024-12-10 MED ORDER — DEXMEDETOMIDINE HCL IN NACL 80 MCG/20ML IV SOLN
INTRAVENOUS | Status: AC
  Filled 2024-12-10: qty 20

## 2024-12-10 MED ORDER — AMISULPRIDE (ANTIEMETIC) 5 MG/2ML IV SOLN: 10.0000 mg | Freq: Once | INTRAVENOUS | Status: DC | PRN

## 2024-12-10 MED ORDER — HYDROMORPHONE HCL 1 MG/ML IJ SOLN
INTRAMUSCULAR | Status: DC | PRN
  Administered 2024-12-10: 11:00:00 .2 mg via INTRAVENOUS
  Administered 2024-12-10: 13:00:00 .1 mg via INTRAVENOUS
  Administered 2024-12-10 (×2): .2 mg via INTRAVENOUS
  Administered 2024-12-10: 12:00:00 .1 mg via INTRAVENOUS
  Administered 2024-12-10 (×5): .2 mg via INTRAVENOUS
  Administered 2024-12-10 (×2): .1 mg via INTRAVENOUS

## 2024-12-10 MED ORDER — KETAMINE HCL 50 MG/5ML IJ SOSY
PREFILLED_SYRINGE | INTRAMUSCULAR | Status: AC
  Filled 2024-12-10: qty 5

## 2024-12-10 MED ORDER — SENNOSIDES-DOCUSATE SODIUM 8.6-50 MG PO TABS: 1.0000 | ORAL_TABLET | Freq: Every day | ORAL | 0 refills | Status: AC

## 2024-12-10 MED ORDER — LIDOCAINE HCL (CARDIAC) PF 100 MG/5ML IV SOSY
PREFILLED_SYRINGE | INTRAVENOUS | Status: DC | PRN
  Administered 2024-12-10: 08:00:00 60 mg via INTRAVENOUS

## 2024-12-10 MED ORDER — PROPOFOL 500 MG/50ML IV EMUL
INTRAVENOUS | Status: DC | PRN
  Administered 2024-12-10: 08:00:00 25 ug/kg/min via INTRAVENOUS

## 2024-12-10 MED ORDER — ACETAMINOPHEN 500 MG PO TABS: 1000.0000 mg | ORAL_TABLET | Freq: Three times a day (TID) | ORAL | 0 refills | Status: AC

## 2024-12-10 MED ORDER — OXYCODONE HCL 5 MG PO TABS: 5.0000 mg | ORAL_TABLET | Freq: Once | ORAL | Status: DC | PRN

## 2024-12-10 MED ORDER — METHOCARBAMOL 500 MG PO TABS: 500.0000 mg | ORAL_TABLET | Freq: Four times a day (QID) | ORAL | 0 refills | Status: AC | PRN

## 2024-12-10 MED ORDER — PROPOFOL 10 MG/ML IV BOLUS
INTRAVENOUS | Status: DC | PRN
  Administered 2024-12-10: 08:00:00 150 mg via INTRAVENOUS

## 2024-12-10 MED ORDER — CEFAZOLIN SODIUM-DEXTROSE 2-4 GM/100ML-% IV SOLN
2.0000 g | INTRAVENOUS | Status: AC
  Filled 2024-12-10: qty 100

## 2024-12-10 MED ORDER — HYDROMORPHONE HCL 1 MG/ML IJ SOLN
INTRAMUSCULAR | Status: AC
  Filled 2024-12-10: qty 1

## 2024-12-10 MED ORDER — SUGAMMADEX SODIUM 200 MG/2ML IV SOLN
INTRAVENOUS | Status: DC | PRN
  Administered 2024-12-10: 17:00:00 200 mg via INTRAVENOUS

## 2024-12-10 MED ORDER — PROPOFOL 10 MG/ML IV BOLUS
INTRAVENOUS | Status: AC
  Filled 2024-12-10: qty 20

## 2024-12-10 MED ORDER — PHENYLEPHRINE HCL (PRESSORS) 10 MG/ML IV SOLN
INTRAVENOUS | Status: DC | PRN
  Administered 2024-12-10 (×3): 80 ug via INTRAVENOUS

## 2024-12-10 MED ORDER — PHENYLEPHRINE 80 MCG/ML (10ML) SYRINGE FOR IV PUSH (FOR BLOOD PRESSURE SUPPORT)
PREFILLED_SYRINGE | INTRAVENOUS | Status: AC
  Filled 2024-12-10: qty 10

## 2024-12-10 MED ORDER — CHLORHEXIDINE GLUCONATE 0.12 % MT SOLN
15.0000 mL | Freq: Once | OROMUCOSAL | Status: AC
  Administered 2024-12-10: 06:00:00 15 mL via OROMUCOSAL

## 2024-12-10 MED ORDER — MIDAZOLAM HCL 5 MG/5ML IJ SOLN
INTRAMUSCULAR | Status: DC | PRN
  Administered 2024-12-10: 08:00:00 .5 mg via INTRAVENOUS
  Administered 2024-12-10: 07:00:00 1.5 mg via INTRAVENOUS

## 2024-12-10 MED ORDER — SODIUM CHLORIDE 0.9 % IR SOLN
Status: DC | PRN
  Administered 2024-12-10: 15:00:00 45000 mL

## 2024-12-10 MED ORDER — MIDAZOLAM HCL (PF) 2 MG/2ML IJ SOLN: 1.0000 mg | INTRAMUSCULAR | Status: DC

## 2024-12-10 MED ORDER — SODIUM CHLORIDE 0.9 % IR SOLN
Status: DC | PRN
  Administered 2024-12-10 (×2): 3000 mL

## 2024-12-10 MED ORDER — 0.9 % SODIUM CHLORIDE (POUR BTL) OPTIME
TOPICAL | Status: DC | PRN
  Administered 2024-12-10: 10:00:00 1000 mL

## 2024-12-10 MED ORDER — LACTATED RINGERS IV SOLN: INTRAVENOUS | Status: DC

## 2024-12-10 MED ORDER — FENTANYL CITRATE (PF) 50 MCG/ML IJ SOSY
PREFILLED_SYRINGE | INTRAMUSCULAR | Status: AC
  Filled 2024-12-10: qty 2

## 2024-12-10 MED ORDER — SODIUM CHLORIDE (PF) 0.9 % IJ SOLN
INTRAMUSCULAR | Status: AC
  Filled 2024-12-10: qty 20

## 2024-12-10 MED ORDER — FENTANYL CITRATE (PF) 250 MCG/5ML IJ SOLN
INTRAMUSCULAR | Status: AC
  Filled 2024-12-10: qty 5

## 2024-12-10 MED ORDER — DEXAMETHASONE SOD PHOSPHATE PF 10 MG/ML IJ SOLN
INTRAMUSCULAR | Status: DC | PRN
  Administered 2024-12-10: 07:00:00 10 mg via INTRAVENOUS
  Administered 2024-12-10: 08:00:00 8 mg via INTRAVENOUS

## 2024-12-10 SURGICAL SUPPLY — 110 items
ANCH HYBRID FIBERTAK SP (Anchor) IMPLANT
ANCHOR DBL 2.6 SLF-PNCH FIBRTK (Anchor) IMPLANT
ANCHOR PEEK 4.75X19.1 SWLK C (Anchor) IMPLANT
ANCHOR SUT SWIVELLOK BIO (Anchor) IMPLANT
BAG COUNTER SPONGE SURGICOUNT (BAG) ×1 IMPLANT
BANDAGE ESMARK 6X9 LF (GAUZE/BANDAGES/DRESSINGS) ×1 IMPLANT
BLADE AVERAGE 25X9 (BLADE) IMPLANT
BLADE EXCALIBUR 4.0X13 (MISCELLANEOUS) IMPLANT
BLADE SURG 15 STRL LF DISP TIS (BLADE) ×2 IMPLANT
BLADE SURG SZ10 CARB STEEL (BLADE) ×1 IMPLANT
BLADE SURG SZ11 CARB STEEL (BLADE) ×1 IMPLANT
BNDG COHESIVE 4X5 TAN STRL LF (GAUZE/BANDAGES/DRESSINGS) ×1 IMPLANT
BNDG COMPR ESMARK 6X3 LF (GAUZE/BANDAGES/DRESSINGS) ×1 IMPLANT
BNDG ELASTIC 4INX 5YD STR LF (GAUZE/BANDAGES/DRESSINGS) ×1 IMPLANT
BNDG ELASTIC 4X5.8 VLCR NS LF (GAUZE/BANDAGES/DRESSINGS) IMPLANT
BNDG ELASTIC 6INX 5YD STR LF (GAUZE/BANDAGES/DRESSINGS) ×2 IMPLANT
BNDG ELASTIC 6X10 VLCR STRL LF (GAUZE/BANDAGES/DRESSINGS) ×1 IMPLANT
BNDG ELASTIC 6X15 VLCR STRL LF (GAUZE/BANDAGES/DRESSINGS) ×1 IMPLANT
CANNULA TWIST IN 8.25X7CM (CANNULA) IMPLANT
CHLORAPREP W/TINT 26 (MISCELLANEOUS) ×1 IMPLANT
CLSR STERI-STRIP ANTIMIC 1/2X4 (GAUZE/BANDAGES/DRESSINGS) ×2 IMPLANT
COVER BACK TABLE 60X90IN (DRAPES) ×1 IMPLANT
COVER FOOTSWITCH UNIV (MISCELLANEOUS) ×2 IMPLANT
COVER SURGICAL LIGHT HANDLE (MISCELLANEOUS) ×1 IMPLANT
CUFF TOURN SGL QUICK 42 (TOURNIQUET CUFF) IMPLANT
CUFF TRNQT CYL 34X4.125X (TOURNIQUET CUFF) ×1 IMPLANT
CUTTER BONE 4.0MM X 13CM (MISCELLANEOUS) IMPLANT
DISSECTOR 3.5MM X 13CM (MISCELLANEOUS) ×1 IMPLANT
DISSECTOR 4.0MM X 13CM (MISCELLANEOUS) ×1 IMPLANT
DRAPE ARTHROSCOPY W/POUCH 114 (DRAPES) ×1 IMPLANT
DRAPE C-ARM 42X120 X-RAY (DRAPES) ×1 IMPLANT
DRAPE C-ARMOR (DRAPES) ×1 IMPLANT
DRAPE HALF SHEET 40X57 (DRAPES) ×1 IMPLANT
DRAPE IMP U-DRAPE 54X76 (DRAPES) IMPLANT
DRAPE INCISE IOBAN 66X45 STRL (DRAPES) ×1 IMPLANT
DRAPE POUCH INSTRU U-SHP 10X18 (DRAPES) ×1 IMPLANT
DRAPE SURG ORHT 6 SPLT 77X108 (DRAPES) ×1 IMPLANT
DRAPE U-SHAPE 47X51 STRL (DRAPES) ×1 IMPLANT
ELECT COATED BLADE 2.86 ST (ELECTRODE) ×1 IMPLANT
ELECT REM PT RETURN 15FT ADLT (MISCELLANEOUS) ×1 IMPLANT
ELECTRODE REM PT RTRN 9FT ADLT (ELECTROSURGICAL) ×1 IMPLANT
FIBER TAPE 2MM (SUTURE) IMPLANT
GAUZE 4X4 16PLY ~~LOC~~+RFID DBL (SPONGE) ×1 IMPLANT
GAUZE PAD ABD 8X10 STRL (GAUZE/BANDAGES/DRESSINGS) ×2 IMPLANT
GAUZE SPONGE 4X4 12PLY STRL (GAUZE/BANDAGES/DRESSINGS) ×1 IMPLANT
GAUZE XEROFORM 1X8 LF (GAUZE/BANDAGES/DRESSINGS) ×1 IMPLANT
GLOVE BIO SURGEON STRL SZ7.5 (GLOVE) ×2 IMPLANT
GLOVE BIO SURGEON STRL SZ8 (GLOVE) ×3 IMPLANT
GLOVE BIOGEL PI IND STRL 7.5 (GLOVE) ×1 IMPLANT
GLOVE BIOGEL PI IND STRL 8 (GLOVE) ×1 IMPLANT
GLOVE SURG SYN 7.0 PF PI (GLOVE) ×1 IMPLANT
GOWN SPEC L4 XLG W/TWL (GOWN DISPOSABLE) ×1 IMPLANT
GOWN STRL REUS W/ TWL LRG LVL3 (GOWN DISPOSABLE) ×1 IMPLANT
GOWN STRL REUS W/ TWL XL LVL3 (GOWN DISPOSABLE) ×1 IMPLANT
GRAFT TISS 60-80 FRZN TENDON (Tissue) IMPLANT
GRAFT TISS 65-80 FRZN TENDON (Tissue) IMPLANT
GRAFT TISS SEMITEND 4-8 (Bone Implant) IMPLANT
IMMOBILIZER KNEE 22 UNIV (SOFTGOODS) IMPLANT
IMPL SYS 2ND FX PEEK 4.75X19.1 (Miscellaneous) IMPLANT
KIT BASIN OR (CUSTOM PROCEDURE TRAY) ×1 IMPLANT
KIT INSRT BABSR STRL DISP BTN (MISCELLANEOUS) IMPLANT
KIT KNEE FIBERTAK DISP (KITS) IMPLANT
KIT TRANSTIBIAL (DISPOSABLE) IMPLANT
KIT TURNOVER KIT A (KITS) ×1 IMPLANT
KIT TURNOVER KIT B (KITS) ×1 IMPLANT
KNIFE GRAFT ACL 10MM 5952 (MISCELLANEOUS) IMPLANT
MANIFOLD NEPTUNE II (INSTRUMENTS) ×1 IMPLANT
NDL SUT 6 .5 CRC .975X.05 MAYO (NEEDLE) IMPLANT
NDL TROCAR 18X20 (NEEDLE) ×1 IMPLANT
NEEDLE TROCAR 18X20 (NEEDLE) ×1 IMPLANT
PACK ARTHROSCOPY WL (CUSTOM PROCEDURE TRAY) ×1 IMPLANT
PAD ARMBOARD POSITIONER FOAM (MISCELLANEOUS) ×2 IMPLANT
PAD CAST 4YDX4 CTTN HI CHSV (CAST SUPPLIES) ×1 IMPLANT
PADDING CAST COTTON 6X4 STRL (CAST SUPPLIES) ×1 IMPLANT
PENCIL BUTTON HOLSTER BLD 10FT (ELECTRODE) ×1 IMPLANT
PENCIL SMOKE EVACUATOR (MISCELLANEOUS) ×1 IMPLANT
PROBE APOLLO 90XL (SURGICAL WAND) IMPLANT
SCREW BIOCOMPOSITE 8X20 INTER (Screw) IMPLANT
SCREW INTERFERENCE FT BC 10X20 (Screw) IMPLANT
SHEET MEDIUM DRAPE 40X70 STRL (DRAPES) ×1 IMPLANT
SOLN 0.9% NACL POUR BTL 1000ML (IV SOLUTION) ×1 IMPLANT
SOLN STERILE WATER BTL 1000 ML (IV SOLUTION) ×1 IMPLANT
SPONGE T-LAP 4X18 ~~LOC~~+RFID (SPONGE) ×2 IMPLANT
STRIP CLOSURE SKIN 1/2X4 (GAUZE/BANDAGES/DRESSINGS) ×1 IMPLANT
SUCTION TUBE FRAZIER 10FR DISP (SUCTIONS) ×1 IMPLANT
SUT ETHIBOND NAB CT1 #1 30IN (SUTURE) ×2 IMPLANT
SUT FIBERSNARE 2 CLSD LOOP (SUTURE) IMPLANT
SUT MNCRL AB 4-0 PS2 18 (SUTURE) ×1 IMPLANT
SUT MON AB 3-0 SH27 (SUTURE) ×2 IMPLANT
SUT VIC AB 0 CT1 27XBRD ANBCTR (SUTURE) IMPLANT
SUT VIC AB 0 CT1 36 (SUTURE) ×2 IMPLANT
SUT VIC AB 2-0 CT1 (SUTURE) IMPLANT
SUT VIC AB 2-0 CT1 TAPERPNT 27 (SUTURE) ×2 IMPLANT
SUT VIC AB 2-0 CT2 27 (SUTURE) ×1 IMPLANT
SUT VIC AB 2-0 SH 27XBRD (SUTURE) IMPLANT
SUT VIC AB 3-0 SH 27X BRD (SUTURE) ×2 IMPLANT
SUTURE FIBERWR #2 38 T-5 BLUE (SUTURE) IMPLANT
SUTURE TAPE 1.3 FIBERLOP 20 ST (SUTURE) IMPLANT
SYR BULB IRRIG 60ML STRL (SYRINGE) ×1 IMPLANT
SYR CONTROL 10ML LL (SYRINGE) IMPLANT
SYSTEM ALLOGRAFT GRAFTLINK CP2 (Anchor) IMPLANT
TOWEL GREEN STERILE (TOWEL DISPOSABLE) ×1 IMPLANT
TOWEL GREEN STERILE FF (TOWEL DISPOSABLE) ×1 IMPLANT
TOWEL OR DSP ST BLU DLX 10/PK (DISPOSABLE) ×1 IMPLANT
TUBING ARTHROSCOPY IRRIG 16FT (MISCELLANEOUS) ×1 IMPLANT
TUBING CONNECTING 10 (TUBING) IMPLANT
UNDERPAD 30X36 HEAVY ABSORB (UNDERPADS AND DIAPERS) ×1 IMPLANT
WAND ABLATOR APOLLO I90 (BUR) IMPLANT
WRAP KNEE MAXI GEL POST OP (GAUZE/BANDAGES/DRESSINGS) IMPLANT
YANKAUER SUCT BULB TIP NO VENT (SUCTIONS) ×1 IMPLANT

## 2024-12-10 NOTE — Progress Notes (Signed)
 Orthopedic Tech Progress Note Patient Details:  Margaret Bentley September 01, 1986 [id number]  Bledsoe brace delivered to the OR desk on behalf of [employer].  Ortho Devices Type of Ortho Device:  (ROM/bledsoe knee brace) Ortho Device/Splint Location: OR desk Ortho Device/Splint Interventions: Ordered      Tinnie Ronal Brasil 12/10/2024, 3:32 PM

## 2024-12-10 NOTE — Anesthesia Procedure Notes (Signed)
 Procedure Name: Intubation Date/Time: 12/10/2024 7:49 AM  Performed by: Kathern Rollene LABOR, CRNAPre-anesthesia Checklist: Patient identified, Emergency Drugs available, Suction available and Patient being monitored Patient Re-evaluated:Patient Re-evaluated prior to induction Oxygen Delivery Method: Circle system utilized Preoxygenation: Pre-oxygenation with 100% oxygen Induction Type: IV induction Ventilation: Mask ventilation without difficulty Laryngoscope Size: Mac and 3 Grade View: Grade II Tube type: Oral Tube size: 7.0 mm Number of attempts: 1 Airway Equipment and Method: Stylet Placement Confirmation: ETT inserted through vocal cords under direct vision, positive ETCO2 and breath sounds checked- equal and bilateral Secured at: 23 cm Tube secured with: Tape Dental Injury: Teeth and Oropharynx as per pre-operative assessment

## 2024-12-10 NOTE — Discharge Instructions (Addendum)
 Orthopedic surgery discharge instructions:  Bandages: - Keep your bandages clean and dry until follow up - Do not use any lotions or creams on or around the incision until instructed by your surgeon.  Showering -  Keep dressings dry - Do not submerge the surgical site underwater.    Activity Restrictions -Non weight bearing left lower extremity with hinged knee locked in extension, no flexion past 90 deg when performing range of motion of the knee  Medications - For mild to moderate pain use Tylenol  as needed around-the-clock, 1000 mg every 8 h for pain. Do not take more than this higher levels can affect kidney function - For breakthrough pain use oxycodone  as necessary. - Please take a stool softener such as docusate and senna while taking a narcotic pain medication - Aspirin  81 mg twice daily x 6 weeks to avoid blood clots  Follow Up:  -You will return to see Dr. Teresa in the office in 2 weeks for routine postoperative check with x-rays. PT will begin after this visit.

## 2024-12-10 NOTE — Op Note (Signed)
 Surgery Date:12/10/2024    Surgeon(s): Rankin Pizza, MD   ASSIST: Ivin Pesa, RFNA  INDICATIONS FOR SURGICAL ASSISTANTS: During the operation, the services of a qualified surgical assistant were medically necessary and indicated to provide adequate surgical exposure and necessary to maintain the limb in the proper position during the procedure to carry out the operation safely and effectively.  With the qualified assistant being present, we would have extended up the operative procedure and made the procedure more technically difficult to perform.    Implants:   Arthrex biocomposite interference screw 10 x 20 mm  Arthrex Biocomposite 8 x20 interference screw  Arthrex tightrope attachable button system x 2 Arthex tight rope with button with internal brace x 2 2.6 mm double loaded fibertak x 4 (2 removed) 4.75 mm swivel lock x 4   Grafts: Semitendinosus 6.5 mm x 230 mm Graft link allograft 10.5 x 85 mm Graft link allograft 10 x 70 mm   ANESTHESIA:  general, and adductor block   IV FLUIDS AND URINE: See anesthesia.   TOURNIQUET:  135 minutes   DRAINS: none   COMPLICATIONS: Adjustable loop fixation failure, s MCL fibertak anchor failure requiring supplementary fixation with interference screw    ESTIMATED BLOOD LOSS: minimal   PREOPERATIVE DIAGNOSES:  1. Left knee complete ACL rupture 2. Left knee complete PCL rupture 3. Left knee complete MCL rupture 4. Left knee lateral meniscus posterior horn tear 5. Arthrofibrosis left knee    POSTOPERATIVE DIAGNOSES:  1. Left knee complete ACL rupture 2. Left knee complete PCL rupture 3. Left knee complete MCL rupture 4. Left knee lateral meniscus posterior horn tear 5. Left knee arthrofibrosis with extensive adhesions 6. Grade 3 cartilage changes lateral tibial plateau 7. Grade 3 cartilage changes patellofemoral compartment (patella and trochlea) 8. Grade 2 cartilage changes medial femoral condyle   PROCEDURES PERFORMED:   1. Left knee arthroscopically assisted ACL reconstruction with allograft 2. Left knee arthroscopically assisted PCL reconstruction with allograft 3. Left knee MCL and POL ligament reconstructions with allograft 4. Left knee lateral meniscus posterior horn radial tear repair 5. Manipulation under anesthesia 6. Lysis of adhesions 7. Chondroplasty lateral femoral condyle, medial femoral condyle and patellofemoral compartment   DESCRIPTION OF PROCEDURE:  Margaret Bentley is a 38 y.o. female with left knee dislocation sustaining a complete ACL, PCL, MCL, and lateral meniscus posterior horn radial tear.  After a 6 week period of prehabilitation to allow for return of ROM and quadriceps strength and MCL bracing the patient had achieved 110 deg knee flexion and persistent instability with laxity on valgus stress with 10 mm of opening both at 30 deg and 0 deg of flexion, lachman 2A and 2+ posterior drawer. We discussed proceeding with arthroscopically assisted allograft ACL, PCL, MCL, posterior oblique ligament reconstruction and partial lateral meniscectomy versus repair due to the patient's persistent instability.  We reviewed the risks benefits and indications of this procedure including but not limited to bleeding, infection, damage to neurovascular structures, need for future surgery, development knee arthrosis, rupture of graft, continued instability of the knee, and development of blood clots, future arthritic change and risk of anesthesia including loss of life.  All questions answered.The patient was identified in the preoperative holding area and the operative extremity was marked. The patient was brought to the operating room and transferred to operating table in a supine position. Satisfactory general anesthesia was induced by anesthesiology.     Examination under anesthesia revealed a grade 2B Lachman, grade 2 pivot shift,  and stable to varus stress and 10 mm of opening to valgus stress at both 0 and  30 deg of knee flexion with stress radiographs comparing the left and right knee. Knee ROM 0-110 deg flexion/extension.  All bony prominences were padded and a thigh tourniquet was placed on the operative extremity.  The extremity was then prepped and draped in normal sterile fashion. A manipulation under anesthesia was performed achieving 0 deg extension and 130 deg knee flexion. Prior to making incision, the ACL and PCL grafts were prepped with passage of the adjustable loop button constructs for both the femoral and tibial sides.  The grafts were place on 15 N of tension and the ACL graft measured 10 x 75 mm on tension and the PCL graft measured 10.5 x 85 mm on tension.  Both grafts were then covered in vancomycin  soaked sponges.  Attention was then turned to the diagnostic arthroscopy and lysis of adhesions. Standard anterolateral, anteromedial arthroscopy portals were obtained. The anteromedial portal was obtained with a spinal needle for localization under direct visualization with subsequent diagnostic findings.    Anteromedial and anterolateral chambers: moderate synovitis with extensive scar tissue and arthrofibrosis. The synovitis  and scar was debrided with a 4.0 mm full radius shaver through both the anteromedial and lateral portals.    Suprapatellar pouch and gutters: severe synovitis or debris. Patella chondral surface: Grade 2 Trochlear chondral surface: Grade 3 Patellofemoral tracking: midline Medial meniscus: Intact with drive through sign noted on inspection of the medial compartment.  Medial femoral condyle flexion bearing surface: Grade 2 Medial femoral condyle extension bearing surface: Grade 0 Medial tibial plateau: Grade 0 Anterior cruciate ligament:Complete mid substance rupture Posterior cruciate ligament:complete midsubstance rupture Lateral meniscus: radial tear approximately 50% of the posterior horn with intact meniscocapsular and root attachments. There was a tear of  the ligament of humphrey.   Lateral femoral condyle flexion bearing surface: Grade 1 Lateral femoral condyle extension bearing surface: Grade 2 Lateral tibial plateau: Grade 2  There was extensive scar tissue in both the medial and lateral compartments as well as in the patellofemoral gutter requiring extensive debridement in order adequately visualize all areas of the knee and achieve full range of motion.   The lateral meniscus was inspected closely and found to have a [place]/red zone radial tear about 2 mm lateral to the posterior root attachment.  This did not complete through the entire meniscus and did not propagate to the capsule attachment.  There was also a tear of the ligament of Humphrey.  The root was stable. There was a unstable piece of meniscal tissue that was debrided back with the motorized shaver until it was stable. The remaining radial tear and ligament of Humphrey was repaired in side to side fashion using 3 2-0 sutures passed with the knee scorpion and tied arthroscopically with good apposition of the meniscus and reduction of the meniscus under the femoral condyle. This completed the lateral meniscus radial tear repair.  We did also perform a chondroplasty of the lateral tibial plateau underneath this tear.  This was performed utilizing the motorized shaver as well.  The scar tissue in the notch and remnant ACL and PCL were debrided with a combination of the shaver and RF wand. Using a motorized shaver a limited notchplasty was performed, exposing the lateral wall and roof of the notch, identifying the ACL footprint  The stump of the anterior cruciate and posterior cruciate ligaments were debrided.  An 18 gauge spinal needle was used  along with an 11 blade and switching stick were then used to make an accessory posteromedial portal  under direct visualization to assist with exposure of the tibial PCL footprint.  This was performed with successive dilations of the portal  followed by  threaded cannula placement over a switching stick for safe passage instruments.  Once satisfied with the degree of exposure, a tibial PCL guide set at 55 deg was placed via the anteromedial portal and placement was confirmed on lateral fluoroscopy and direct visualization. The anteromedial approach was taken in line with the planned incision for the MCL reconstruction and the sartorial fascia was elevated along with periosteum taking care to protect the saphenous vein.   The Flip Cutter was then advanced under fluoroscopic guidance demonstrating excellent position at the PCL foot print.  Under direct visualization and with the knee in 90 degrees to protect the posterior neurovascular structures, the Flip cutter was opened to create at 40 mm tunnel with 10.5 mm diameter per the sizing of the graft. All bone debris was removed with a shaver and a fiberstick suture passer was passed up the tunnel and retrieved out the anteromedial portal where it was luggage tagged for future graft passage.  The ACL tibial guide was then used at 55 deg.  The guide was placed in line with the anterior horn of the lateral meniscus at the location of the prior ACL tibial stump.  Using the flip cutter, a 10 mm diameter by 25 mm length blind tunnel was made and a fiberstick suture passer was passed and retrieved out the anteromedial portal.  Satisfied with the position of the tibial tunnels, attention was then turned to the femoral tunnels.   The ACL femoral tunnel was placed using a Flip cutter guide with tunnel position at approximately 1:30 position.  Satisfied with this position a counter incision was made anterolaterally to accommodate the guide without soft tissue interposition and the guide cannula was brought to the outer cortex with guide set to 55 deg.  A 10 mm tunnel 25 mm deep was made using the Flip Cutter and a fiber stick was again passed and retrieved through the anteromedial portal and luggage tagged to  itself.  Attention was then finally turned to the PCL femoral tunnel.  Using the PCL femoral tunnel guide for the Flip Cutter was set at 75 deg and the guide was placed over the PCL femoral footprint. A proximal anteromedial incision was made for passage of the guide cannula to bone without soft tissue interposition and then the Flip Cutter was used to make a 10.5 mm diameter x 25 mm deep blind tunnel. A final fiber stick was passed through this tunnel and retrieved out the anteromedial portal. All bone debris from drilling was removed and satisfied with the tunnel positions, the PCL graft was then passed through the anteromedial portal into the knee and dunked into the femoral and tibial tunnels. Fluoroscopy was used to confirm the femoral button had flipped on the outer cortex.    Next the ACL graft was delivered into the knee and into the femoral and tibial tunnels in similar fashion under direct visualization with the arthroscope and fluoroscopy to confirm button placement on the femoral cortex.  Tension was then placed on the tibia suture limbs for the ACL and PCL grafts and the knee was cycled 15 times to remove creep.     The knee was then flexed to 90 degrees and an anterior drawer was applied.  The tibial  button was applied with the internal brace sutures independent of the button. The button was tensioned with excellent tension of the PCL graft fibers and restoration of a normal posterior drawer   Attention was then turned to the ACL graft.  The knee was then brought into 30 deg of flexion with a posterior drawer applied.  The tibial button was then applied with the internal brace sutures.The button was tensioned with excellent tension of the ACL fibers and restoration of normal lachman.  It was noted at this time that the PCL graft in the femoral tunnel had lost some apposition.  The tight rope in the PCL femoral tunnel was again tensioned, however during final tensioning a pop was noted with loss  of graft fixation with the adjustable loop.  The internal brace sutures were still intact.  Given this, the decision was made to back up the PCL femoral fixation with an 8x20 bioabsorbable interference screw.  Unfortunately this did not provide sufficient fixation.  It was then removed an replaced with a 10 x 20 bioabsorbable interference screw with excellent fixation of the graft.  The tibial tunnel sutures were retensioned and then tied.  The internal brace was then fixated independently with 4.75 swivelock with excellent purchase.  The ACL graft was re examined and found to have excellent tension and fixation. Both femoral and tibial suture ends were tied.  Decision was made to tie the internal brace for the ACL independently over the button due to concerns regarding tunnel convergent with the PCL tunnel if a swivel lock were to be used.  Satisfied with the ACL and PCL grafts, the knee was then brought in full extension and 30 deg of flexion and a valgus stress was applied under flouroscopy demonstrating residual gapping of the medial knee.  Decision was made at this point to proceed with the [facility] reconstruction.  A 6.5 x 230 mmg semitendinosus graft had ends preppared using fiberloop sutures and tensioned at 15 N and soaked in vancomycin  soaked sponge on the back table.  The leg was exsanguinated with an esmarch bandage and the thigh tourniquet was inflated to 250 mm Hg.  The antero medial incision was then extended up to the medial femoral condyle and full thickness skin flaps were developed.  Care was made to protect the neurovascular bundle including the infrapatellar branch of the saphenous nerve.  A beath pin was placed just posterior to the medial femoral epicondyle and pins were placed at the tibial insertion of the superficial MCL 6 cm distal to the joint line as well as the posterior oblique ligament foot print 1 cm distal to the joint line.  Isometry was test and the pins were adjusted until the  isometric point was found for both the superficial MCL and posterior oblique ligament pins. Satisfied with the isometry, the femoral beath pin was advanced proximally and anteriorly being sure to miss the PCL fixation and out of the skin anterolaterally.  This was overdrilled with a 6.5 mm reamer as the doubled over graft on tension was found to have 6.5 mm diameter.  7 cm of graft length was measured out for the postero oblique ligament and 15 cm was measure out for the superficial MCL.  The doubled over portion was secured with an 0 vicryl suture and then passed via the beath pin and a #1 ethibond.  After the graft was dunked into the femoral tunnel, a nitinol wire was passed into the tunnel and a 4.75 mm swivel lock  was placed into the tunnel, but it did not have satisfactory fixation so the previously opened 8 x 20 biocomposite interfere screw was inserted with excellent fixation.  The posterior oblique and superficial MCL limbs were then positioned and 1 2.6 mm fibertak was placed at the posterior oblique ligament foot print at the site of the prior pin, 1 2.6 mm fibertak was 1 cm distal to the joint line to reconstruct the deep MCL and 2 2.6 mm fibertaks were placed 6 cm from the joint line to reconstruct superficial MCL at the site of the prior pin.  The Posterior oblique graft limb was tensioned and fixated in extension with varus stress applied with good fixation.  This was then over sewed with 0 vicryl sutures with excellent purchase.  The superficial MCL limb was then passed through its 3 fibertaks suture limbs and tensioned at 30 deg of knee flexion with varus stress.  The knee was brought through flexion and extension and then retested and the superficial MCL was found to have too much laxity.  It was then retensioned, sewed to itself and an internal brace was added with 4.75 swivel locks proximally and distally to further support the construct, but fixation was again found to be inadequate and isometry  could not be adequately reproduced with the internal brace.  Decision was made at this time to remove the distal two 2.6 fibertaks and excise the internal brace to retension and revise the MCL reconstruction.  A beath pin was placed through the point of isometry at 6 cm distal to the joint line and passed out anterolaterally.  The allograft ligament was measured, cut and whipstitched to allow for 15 mm of graft in the planned tunnel.  The beath pin was over drilled with a 5 mm tunnel to accommodate the 5 mm diameter graft.  The graft suture was then passed via the beath pin and a nitinol wire was introduced into the socket.  The knee was placed in 30 degrees of flexion and varus stress and the graft was maximally tensioned.  A 4.75 swivel lock was introduced into the canal as an interference screw and had excellent purchase.  The knee was then stressed again under flouroscopy and found to be stable at 0 and 30 degrees of flexion.  The wound was irrigated with copious irrigation and 1 g of vancomycin  powder was placed in the wound.  The tourniquet was released and hemostasis was achieved.  All remaining passing stiches were cut and removed, the deep tissues were closed with 0 vicryl, the subcutaneous tissues were closed with 2-0 vicryl and skin with 3-0 monocryl for running subcuticular closure. Steri strips were applied. Dressings were applied and a brace placed and locked in full extension..  The patient was awakened and taken to recovery room in satisfactory condition.   POSTOPERATIVE PLAN:  Margaret Bentley will be non weight bearing on crutches for 6 weeks with limitation of knee flexion to 90 degrees due to meniscal repair. She will be on 81 mg asa  BID for 6 weeks for DVT PPX and  will return to the clinic to see the surgeon in 2 weeks.   Rankin Pizza, MD

## 2024-12-10 NOTE — Anesthesia Procedure Notes (Addendum)
 Anesthesia Regional Block: Adductor canal block   Pre-Anesthetic Checklist: , timeout performed,  Correct Patient, Correct Site, Correct Laterality,  Correct Procedure, Correct Position, site marked,  Risks and benefits discussed,  Pre-op evaluation,  At surgeon's request and post-op pain management  Laterality: Left  Prep: Maximum Sterile Barrier Precautions used, chloraprep       Needles:  Injection technique: Single-shot  Needle Type: Echogenic Stimulator Needle     Needle Length: 9cm  Needle Gauge: 21     Additional Needles:   Procedures:,,,, ultrasound used (permanent image in chart),,    Narrative:  Start time: 12/10/2024 7:22 AM End time: 12/10/2024 7:25 AM Injection made incrementally with aspirations every 5 mL. Anesthesiologist: Niels Marien CROME, MD

## 2024-12-10 NOTE — Transfer of Care (Signed)
 Immediate Anesthesia Transfer of Care Note  Patient: Margaret Bentley  Procedure(s) Performed: KNEE ARTHROSCOPY WITH ANTERIOR CRUCIATE LIGAMENT (ACL) REPAIR (Left: Knee) ARTHROSCOPY, KNEE, WITH PCL RECONSTRUCTION (Left: Knee) RECONSTRUCTION, LIGAMENT, MEDIAL COLLATERAL, KNEE (Left: Knee) ARTHROSCOPY, KNEE, WITH MENISCUS REPAIR (Left: Knee)  Patient Location: PACU  Anesthesia Type:General and Regional  Level of Consciousness: sedated and responds to stimulation  Airway & Oxygen Therapy: Patient Spontanous Breathing and Patient connected to face mask oxygen  Post-op Assessment: Report given to RN and Post -op Vital signs reviewed and stable  Post vital signs: Reviewed and stable  Last Vitals:  Vitals Value Taken Time  BP 128/76 12/10/24 17:25  Temp    Pulse 68 12/10/24 17:25  Resp 7 12/10/24 17:25  SpO2 98 % 12/10/24 17:25  Vitals shown include unfiled device data.  Last Pain:  Vitals:   12/10/24 0611  TempSrc: Oral  PainSc:          Complications: No notable events documented.

## 2024-12-10 NOTE — H&P (Signed)
 ORTHOPAEDIC H&P  REQUESTING PHYSICIAN: Teresa Rankin ORN, MD  PCP:  Pcp, No  Chief Complaint: Left knee multi ligamentous knee dislocation (ACL/PCL/MCL/lateral meniscal root)  HPI: Margaret Bentley is a 38 y.o. female with left knee multi ligamentous knee dislocation (ACL/PCL/MCL/lateral meniscal root) who has been seen and evaluated in my clinic and underwent pre operative rehab to improve knee range of motion. She presents today for surgical intervention and stabilization of her knee.  She has had no changes in her medical or surgical history. She has been NPO since midnight.  History reviewed. No pertinent past medical history. Past Surgical History:  Procedure Laterality Date   CESAREAN SECTION     Social History   Socioeconomic History   Marital status: Married    Spouse name: Not on file   Number of children: Not on file   Years of education: Not on file   Highest education level: Not on file  Occupational History   Not on file  Tobacco Use   Smoking status: Never   Smokeless tobacco: Never  Vaping Use   Vaping status: Never Used  Substance and Sexual Activity   Alcohol use: Yes    Comment: occas.   Drug use: Never   Sexual activity: Yes  Other Topics Concern   Not on file  Social History Narrative   Not on file   Social Drivers of Health   Tobacco Use: Low Risk (12/10/2024)   Patient History    Smoking Tobacco Use: Never    Smokeless Tobacco Use: Never    Passive Exposure: Not on file  Financial Resource Strain: Low Risk (11/06/2024)   Received from [facility]   Overall Financial Resource Strain (CARDIA)    How hard is it for you to pay for the very basics like food, housing, medical care, and heating?: Not very hard  Food Insecurity: No Food Insecurity (11/06/2024)   Received from [facility]   Epic    Within the past 12 months, you worried that your food would run out before you got the money to buy more.: Never true    Within the past 12 months,  the food you bought just didn't last and you didn't have money to get more.: Never true  Transportation Needs: No Transportation Needs (11/06/2024)   Received from [facility]    In the past 12 months, has lack of transportation kept you from medical appointments or from getting medications?: No    In the past 12 months, has lack of transportation kept you from meetings, work, or from getting things needed for daily living?: No  Physical Activity: Unknown (11/06/2024)   Received from [facility]   Exercise Vital Sign    On average, how many days per week do you engage in moderate to strenuous exercise (like a brisk walk)?: Patient declined    Minutes of Exercise per Session: Not on file  Stress: No Stress Concern Present (11/06/2024)   Received from [facility] - Occupational Stress Questionnaire    Do you feel stress - tense, restless, nervous, or anxious, or unable to sleep at night because your mind is troubled all the time - these days?: Only a little  Social Connections: Socially Integrated (11/06/2024)   Received from [facility]   Social Network    How would you rate your social network (family, work, friends)?: Good participation with social networks  Depression (PHQ2-9): Not on file  Alcohol  Screen: Not on file  Housing: Low Risk (11/06/2024)   Received from [facility]    In the last 12 months, was there a time when you were not able to pay the mortgage or rent on time?: No    In the past 12 months, how many times have you moved where you were living?: 1    At any time in the past 12 months, were you homeless or living in a shelter (including now)?: No  Utilities: Not At Risk (11/06/2024)   Received from [facility]    In the past 12 months has the electric, gas, oil, or water company threatened to shut off services in your home?: No  Health Literacy: Not on file   Family History  Problem Relation  Age of Onset   Hyperlipidemia Mother    Hyperlipidemia Father    Allergies[1] Prior to Admission medications  Not on File   No results found.  Positive ROS: All other systems have been reviewed and were otherwise negative with the exception of those mentioned in the HPI and as above.  Physical Exam: General: Alert, no acute distress Cardiovascular: No pedal edema Respiratory: No cyanosis, no use of accessory musculature GI: No organomegaly, abdomen is soft and non-tender Skin: No lesions in the area of chief complaint Neurologic: Sensation intact distally Psychiatric: Patient is competent for consent with normal mood and affect Lymphatic: No axillary or cervical lymphadenopathy  MUSCULOSKELETAL: Left lower extremity - skin intact - TTP medial and lateral joint line - knee ROM 0-110 deg flexion/extension - Grade 3 laxity on valgus stress,  2A lachman, 2+ posterior drawer, stable to varus stress - Positive mcmurray - sensation intact to light touch to sural, saphenous, tibial, deep and superficial peroneal nerve distribution - 2+ DP pulse  Assessment: 38 year old female with left knee multi ligamentous knee dislocation (ACL/PCL/MCL/lateral meniscal root) who presents for left knee arthroscopically assisted ACL, PCL and MCL reconstructions with allograft and lateral meniscus root repair today. Risks, benefits and alternative were again reviewed with the patient and she expressed understanding and wishes to proceed with the surgery as discussed.  All questions were answered to her satisfaction.   Plan: NWB LLE post op NPO Proceed with OR today Adductor canal block with anesthesia Oxy and tylenol  to be prescribed for pain control Senna and docusate while taking a narcotic ASA 81 MG BID x 6 weeks T-ROM brace to be provided today Return to clinic 2 weeks post op for skin check  Dispo: pending OR    Rankin LELON Pizza, MD    12/10/2024 7:14 AM     [1] No Known  Allergies

## 2024-12-10 NOTE — Anesthesia Preprocedure Evaluation (Addendum)
 Anesthesia Evaluation  Patient identified by MRN, date of birth, ID band Patient awake    Reviewed: Allergy & Precautions, NPO status , Patient's Chart, lab work & pertinent test results  Airway Mallampati: II  TM Distance: >3 FB Neck ROM: Full    Dental no notable dental hx. (+) Teeth Intact, Dental Advisory Given   Pulmonary neg pulmonary ROS   Pulmonary exam normal breath sounds clear to auscultation       Cardiovascular negative cardio ROS Normal cardiovascular exam Rhythm:Regular Rate:Normal     Neuro/Psych negative neurological ROS  negative psych ROS   GI/Hepatic negative GI ROS, Neg liver ROS,,,  Endo/Other  negative endocrine ROS    Renal/GU negative Renal ROS  negative genitourinary   Musculoskeletal negative musculoskeletal ROS (+)    Abdominal   Peds  Hematology negative hematology ROS (+)   Anesthesia Other Findings   Reproductive/Obstetrics                              Anesthesia Physical Anesthesia Plan  ASA: 1  Anesthesia Plan: General and Regional   Post-op Pain Management: Regional block*, Tylenol  PO (pre-op)*, Ketamine  IV*, Dilaudid  IV and Precedex    Induction: Intravenous  PONV Risk Score and Plan: 3 and Midazolam , Dexamethasone  and Ondansetron   Airway Management Planned: Oral ETT  Additional Equipment:   Intra-op Plan:   Post-operative Plan: Extubation in OR  Informed Consent: I have reviewed the patients History and Physical, chart, labs and discussed the procedure including the risks, benefits and alternatives for the proposed anesthesia with the patient or authorized representative who has indicated his/her understanding and acceptance.     Dental advisory given  Plan Discussed with: CRNA  Anesthesia Plan Comments:          Anesthesia Quick Evaluation

## 2024-12-10 NOTE — Brief Op Note (Signed)
 12/10/2024  5:35 PM  PATIENT:  Margaret Bentley  38 y.o. female  PRE-OPERATIVE DIAGNOSIS:  Complete tear of the left ACL, PCL, MCL, lateral meniscus posterior horn tear, arthrofibrosis  POST-OPERATIVE DIAGNOSIS:  Same  PROCEDURE:  KNEE ARTHROSCOPY WITH ANTERIOR CRUCIATE LIGAMENT (ACL), PCL, MCL RECONSTRUCTION with ALLOGRAFT,MANIPULATION UNDER ANESTHESIA, LYSIS OF ADHESIONS, LATERAL MENISCUS REPAIR, CHONDROPLASTY Medial and lateral femoral condyle  SURGEON:  Rankin LELON Pizza, MD  ANESTHESIA:   General  COMPLICATIONS: None  EBL: 150 cc  Rankin Pizza, MD

## 2024-12-10 NOTE — Anesthesia Postprocedure Evaluation (Signed)
 Anesthesia Post Note  Patient: Margaret Bentley  Procedure(s) Performed: KNEE ARTHROSCOPY WITH ANTERIOR CRUCIATE LIGAMENT (ACL) REPAIR (Left: Knee) ARTHROSCOPY, KNEE, WITH PCL RECONSTRUCTION (Left: Knee) RECONSTRUCTION, LIGAMENT, MEDIAL COLLATERAL, KNEE (Left: Knee) ARTHROSCOPY, KNEE, WITH MENISCUS REPAIR (Left: Knee)     Patient location during evaluation: PACU Anesthesia Type: Regional and General Level of consciousness: awake and alert Pain management: pain level controlled Vital Signs Assessment: post-procedure vital signs reviewed and stable Respiratory status: spontaneous breathing, nonlabored ventilation, respiratory function stable and patient connected to nasal cannula oxygen Cardiovascular status: blood pressure returned to baseline and stable Postop Assessment: no apparent nausea or vomiting Anesthetic complications: no   No notable events documented.  Last Vitals:  Vitals:   12/10/24 1845 12/10/24 1900  BP: (!) 118/59 122/60  Pulse: 62 71  Resp: 15 16  Temp:    SpO2: 92% 95%    Last Pain:  Vitals:   12/10/24 1900  TempSrc:   PainSc: 2                  Garnette FORBES Skillern

## 2024-12-13 ENCOUNTER — Encounter (HOSPITAL_COMMUNITY): Payer: Self-pay
